# Patient Record
Sex: Male | Born: 1950 | Race: Black or African American | Hispanic: No | Marital: Married | Smoking: Former smoker
Health system: Southern US, Community
[De-identification: ages and names within clinical notes are randomized; demographics above are authoritative.]

## PROBLEM LIST (undated history)

## (undated) DIAGNOSIS — J189 Pneumonia, unspecified organism: Secondary | ICD-10-CM

## (undated) DIAGNOSIS — I2510 Atherosclerotic heart disease of native coronary artery without angina pectoris: Secondary | ICD-10-CM

## (undated) DIAGNOSIS — I209 Angina pectoris, unspecified: Secondary | ICD-10-CM

## (undated) DIAGNOSIS — I219 Acute myocardial infarction, unspecified: Secondary | ICD-10-CM

## (undated) DIAGNOSIS — E7800 Pure hypercholesterolemia, unspecified: Secondary | ICD-10-CM

## (undated) DIAGNOSIS — J449 Chronic obstructive pulmonary disease, unspecified: Secondary | ICD-10-CM

## (undated) DIAGNOSIS — I10 Essential (primary) hypertension: Secondary | ICD-10-CM

## (undated) DIAGNOSIS — R0602 Shortness of breath: Secondary | ICD-10-CM

## (undated) DIAGNOSIS — M1990 Unspecified osteoarthritis, unspecified site: Secondary | ICD-10-CM

## (undated) HISTORY — DX: Unspecified osteoarthritis, unspecified site: M19.90

## (undated) HISTORY — DX: Essential (primary) hypertension: I10

## (undated) HISTORY — PX: CERVICAL DISC SURGERY: SHX588

## (undated) HISTORY — DX: Pure hypercholesterolemia, unspecified: E78.00

## (undated) HISTORY — PX: CORONARY ANGIOPLASTY WITH STENT PLACEMENT: SHX49

## (undated) HISTORY — DX: Atherosclerotic heart disease of native coronary artery without angina pectoris: I25.10

---

## 1997-11-22 ENCOUNTER — Emergency Department (HOSPITAL_COMMUNITY): Admission: EM | Admit: 1997-11-22 | Discharge: 1997-11-22 | Payer: Self-pay | Admitting: Emergency Medicine

## 1997-12-11 ENCOUNTER — Encounter: Admission: RE | Admit: 1997-12-11 | Discharge: 1997-12-11 | Payer: Self-pay | Admitting: Family Medicine

## 1997-12-30 ENCOUNTER — Encounter: Admission: RE | Admit: 1997-12-30 | Discharge: 1997-12-30 | Payer: Self-pay | Admitting: Sports Medicine

## 2003-11-24 ENCOUNTER — Emergency Department (HOSPITAL_COMMUNITY): Admission: EM | Admit: 2003-11-24 | Discharge: 2003-11-24 | Payer: Self-pay | Admitting: Family Medicine

## 2005-12-04 ENCOUNTER — Emergency Department (HOSPITAL_COMMUNITY): Admission: EM | Admit: 2005-12-04 | Discharge: 2005-12-04 | Payer: Self-pay | Admitting: Emergency Medicine

## 2007-04-15 ENCOUNTER — Emergency Department (HOSPITAL_COMMUNITY): Admission: EM | Admit: 2007-04-15 | Discharge: 2007-04-15 | Payer: Self-pay | Admitting: Emergency Medicine

## 2007-10-17 ENCOUNTER — Ambulatory Visit: Payer: Self-pay | Admitting: Internal Medicine

## 2007-10-17 ENCOUNTER — Inpatient Hospital Stay (HOSPITAL_COMMUNITY): Admission: EM | Admit: 2007-10-17 | Discharge: 2007-10-20 | Payer: Self-pay | Admitting: Emergency Medicine

## 2007-10-17 DIAGNOSIS — I2510 Atherosclerotic heart disease of native coronary artery without angina pectoris: Secondary | ICD-10-CM | POA: Insufficient documentation

## 2007-10-19 ENCOUNTER — Encounter: Payer: Self-pay | Admitting: Cardiovascular Disease

## 2007-10-19 ENCOUNTER — Ambulatory Visit: Payer: Self-pay | Admitting: Vascular Surgery

## 2007-11-02 ENCOUNTER — Ambulatory Visit: Payer: Self-pay | Admitting: Cardiovascular Disease

## 2007-11-02 LAB — CONVERTED CEMR LAB
HCT: 37.3 % — ABNORMAL LOW (ref 39.0–52.0)
Hemoglobin: 11.9 g/dL — ABNORMAL LOW (ref 13.0–17.0)
Lymphocytes Relative: 35.1 % (ref 12.0–46.0)
MCV: 86.1 fL (ref 78.0–100.0)
Monocytes Relative: 12.8 % — ABNORMAL HIGH (ref 3.0–12.0)
Neutrophils Relative %: 49.4 % (ref 43.0–77.0)
RBC: 4.33 M/uL (ref 4.22–5.81)
RDW: 13.6 % (ref 11.5–14.6)

## 2007-11-07 ENCOUNTER — Ambulatory Visit: Payer: Self-pay | Admitting: Cardiovascular Disease

## 2007-11-14 ENCOUNTER — Inpatient Hospital Stay (HOSPITAL_COMMUNITY): Admission: AD | Admit: 2007-11-14 | Discharge: 2007-11-15 | Payer: Self-pay | Admitting: Cardiovascular Disease

## 2007-11-14 ENCOUNTER — Ambulatory Visit: Payer: Self-pay | Admitting: Cardiovascular Disease

## 2007-11-27 ENCOUNTER — Ambulatory Visit: Payer: Self-pay | Admitting: Cardiovascular Disease

## 2007-12-29 ENCOUNTER — Ambulatory Visit: Payer: Self-pay | Admitting: Cardiovascular Disease

## 2008-01-26 ENCOUNTER — Emergency Department (HOSPITAL_COMMUNITY): Admission: EM | Admit: 2008-01-26 | Discharge: 2008-01-26 | Payer: Self-pay | Admitting: Emergency Medicine

## 2008-05-13 ENCOUNTER — Ambulatory Visit: Payer: Self-pay | Admitting: Cardiovascular Disease

## 2008-05-13 LAB — CONVERTED CEMR LAB
ALT: 18 U/L
AST: 21 U/L
Albumin: 3.9 g/dL
Alkaline Phosphatase: 48 U/L
Bilirubin, Direct: 0.1 mg/dL
Cholesterol: 184 mg/dL
HDL: 75.4 mg/dL
LDL Cholesterol: 89 mg/dL
Total Bilirubin: 0.6 mg/dL
Total CHOL/HDL Ratio: 2.4
Total Protein: 8 g/dL
Triglycerides: 100 mg/dL
VLDL: 20 mg/dL

## 2008-05-18 DIAGNOSIS — I10 Essential (primary) hypertension: Secondary | ICD-10-CM | POA: Insufficient documentation

## 2008-05-18 DIAGNOSIS — E785 Hyperlipidemia, unspecified: Secondary | ICD-10-CM | POA: Insufficient documentation

## 2008-05-18 DIAGNOSIS — E7800 Pure hypercholesterolemia, unspecified: Secondary | ICD-10-CM | POA: Insufficient documentation

## 2008-05-21 ENCOUNTER — Encounter: Payer: Self-pay | Admitting: Cardiovascular Disease

## 2008-05-21 ENCOUNTER — Ambulatory Visit: Payer: Self-pay | Admitting: Cardiovascular Disease

## 2008-08-28 ENCOUNTER — Telehealth: Payer: Self-pay | Admitting: Cardiovascular Disease

## 2008-09-02 ENCOUNTER — Telehealth: Payer: Self-pay | Admitting: Cardiovascular Disease

## 2008-09-03 ENCOUNTER — Emergency Department (HOSPITAL_COMMUNITY): Admission: EM | Admit: 2008-09-03 | Discharge: 2008-09-03 | Payer: Self-pay | Admitting: Emergency Medicine

## 2008-09-03 ENCOUNTER — Ambulatory Visit: Payer: Self-pay | Admitting: Cardiology

## 2008-11-06 ENCOUNTER — Telehealth (INDEPENDENT_AMBULATORY_CARE_PROVIDER_SITE_OTHER): Payer: Self-pay | Admitting: *Deleted

## 2008-11-11 ENCOUNTER — Encounter: Payer: Self-pay | Admitting: Cardiovascular Disease

## 2009-01-28 ENCOUNTER — Encounter (INDEPENDENT_AMBULATORY_CARE_PROVIDER_SITE_OTHER): Payer: Self-pay | Admitting: *Deleted

## 2009-02-11 ENCOUNTER — Telehealth: Payer: Self-pay | Admitting: Cardiovascular Disease

## 2009-02-26 ENCOUNTER — Encounter (INDEPENDENT_AMBULATORY_CARE_PROVIDER_SITE_OTHER): Payer: Self-pay | Admitting: *Deleted

## 2009-03-03 ENCOUNTER — Ambulatory Visit: Payer: Self-pay | Admitting: Cardiovascular Disease

## 2009-03-31 ENCOUNTER — Encounter (INDEPENDENT_AMBULATORY_CARE_PROVIDER_SITE_OTHER): Payer: Self-pay | Admitting: *Deleted

## 2009-04-03 ENCOUNTER — Encounter: Payer: Self-pay | Admitting: Cardiovascular Disease

## 2009-04-22 ENCOUNTER — Telehealth (INDEPENDENT_AMBULATORY_CARE_PROVIDER_SITE_OTHER): Payer: Self-pay | Admitting: *Deleted

## 2009-05-13 ENCOUNTER — Telehealth: Payer: Self-pay | Admitting: Cardiovascular Disease

## 2009-05-26 ENCOUNTER — Ambulatory Visit: Payer: Self-pay | Admitting: Cardiovascular Disease

## 2009-06-03 ENCOUNTER — Encounter: Payer: Self-pay | Admitting: Cardiovascular Disease

## 2009-06-04 LAB — CONVERTED CEMR LAB
Albumin: 3.8 g/dL (ref 3.5–5.2)
Alkaline Phosphatase: 53 units/L (ref 39–117)
BUN: 10 mg/dL (ref 6–23)
CO2: 30 meq/L (ref 19–32)
Calcium: 9.4 mg/dL (ref 8.4–10.5)
Chloride: 98 meq/L (ref 96–112)
Cholesterol: 205 mg/dL — ABNORMAL HIGH (ref 0–200)
Creatinine, Ser: 1.1 mg/dL (ref 0.4–1.5)
Direct LDL: 131.4 mg/dL
Glucose, Bld: 114 mg/dL — ABNORMAL HIGH (ref 70–99)
HDL: 73.7 mg/dL (ref >39.00)
Sodium: 138 meq/L (ref 135–145)
VLDL: 31 mg/dL (ref 0.0–40.0)

## 2009-06-20 ENCOUNTER — Telehealth (INDEPENDENT_AMBULATORY_CARE_PROVIDER_SITE_OTHER): Payer: Self-pay | Admitting: *Deleted

## 2009-06-26 ENCOUNTER — Telehealth: Payer: Self-pay | Admitting: *Deleted

## 2009-06-26 ENCOUNTER — Telehealth: Payer: Self-pay | Admitting: Family Medicine

## 2009-07-09 ENCOUNTER — Telehealth (INDEPENDENT_AMBULATORY_CARE_PROVIDER_SITE_OTHER): Payer: Self-pay | Admitting: *Deleted

## 2009-07-15 ENCOUNTER — Encounter: Payer: Self-pay | Admitting: *Deleted

## 2009-08-02 ENCOUNTER — Emergency Department (HOSPITAL_COMMUNITY): Admission: EM | Admit: 2009-08-02 | Discharge: 2009-08-02 | Payer: Self-pay | Admitting: Emergency Medicine

## 2009-08-11 ENCOUNTER — Telehealth (INDEPENDENT_AMBULATORY_CARE_PROVIDER_SITE_OTHER): Payer: Self-pay | Admitting: *Deleted

## 2009-09-12 ENCOUNTER — Telehealth (INDEPENDENT_AMBULATORY_CARE_PROVIDER_SITE_OTHER): Payer: Self-pay | Admitting: *Deleted

## 2009-09-18 ENCOUNTER — Ambulatory Visit: Payer: Self-pay | Admitting: Cardiovascular Disease

## 2009-09-18 ENCOUNTER — Ambulatory Visit: Payer: Self-pay

## 2010-06-16 NOTE — Miscellaneous (Signed)
Summary: Do Not Reschedule  Pt has no showed 2 NP appts.  Per [facility] policy is not allowed to reschedule.  Dennison Nancy RN  July 15, 2009 9:36 PM

## 2010-06-16 NOTE — Assessment & Plan Note (Signed)
Summary: f53m   Visit Type:  6 months follow up Primary Provider:  none  CC:  No complaints.  History of Present Illness: 60 year-old [name] with CAD s/p NSTEMI June 2009. Treated with PCI of the RCA with multiple overlapping DES and staged PCI of the LCx. His only complaint is episodic ankle swelling. He denies pain in his calf muscles. He denies chest pain, dyspnea, orthopnea, or PND. He has been compliant with his medications and has no other complaints. He reports blood pressure last week was 131/55 when checked at the pharmacy.  Current Medications (verified): 1)  Coreg 25 Mg Tabs (Carvedilol) .... Take 1 Tablet By Mouth Twice A Day 2)  Lisinopril 20 Mg Tabs (Lisinopril) .... Take 1 Tablet Once A Day 3)  Plavix 75 Mg Tabs (Clopidogrel Bisulfate) .... Take 1 Tablet By Mouth Once A Day 4)  Cialis 10 Mg Tabs (Tadalafil) .... As Needed 5)  Simvastatin 40 Mg Tabs (Simvastatin) .... Take One Tablet By Mouth Daily At Bedtime 6)  Amlodipine Besylate 5 Mg Tabs (Amlodipine Besylate) .... Take One Tablet By Mouth Daily 7)  Hydrochlorothiazide 12.5 Mg Tabs (Hydrochlorothiazide) .... Take One Tablet By Mouth Daily. 8)  Aspirin Ec 325 Mg Tbec (Aspirin) .... Take One Tablet By Mouth Daily 9)  Nitrostat 0.4 Mg Subl (Nitroglycerin) .Marland Kitchen.. 1 Tablet Under Tongue At Onset of Chest Pain; You May Repeat Every 5 Minutes For Up To 3 Doses.  Allergies (verified): No Known Drug Allergies  Past History:  Past medical history reviewed for relevance to current acute and chronic problems.  Past Medical History: Reviewed history from 03/03/2009 and no changes required. CAD s/p NSTEMI 10/2007, RCA and LCx PCI with drug-eluting stents HTN, poorly controlled DJD Hypercholesterolemia  Review of Systems       Negative except as per HPI   Vital Signs:  Patient profile:   60 year old male Height:      73 inches Weight:      175.75 pounds BMI:     23.27 Pulse rate:   73 / minute Pulse rhythm:    regular Resp:     18 per minute BP sitting:   160 / 94  (left arm) Cuff size:   large  Vitals Entered By: Vikki Ports (Sep 18, 2009 11:20 AM)  Physical Exam  General:  Pt is alert and oriented, in no acute distress. HEENT: normal Neck: normal carotid upstrokes without bruits, JVP normal Lungs: CTA CV: RRR without murmur or gallop Abd: soft, NT, positive BS, no bruit, no organomegaly Ext: 1+ bilateral ankle edema. peripheral pulses diminished bilaterally Skin: warm and dry without rash    EKG  Procedure date:  09/18/2009  Findings:      Normal sinus rhythm, heart rate 73 beats per minute, cannot rule out inferior MI age indeterminate.  Impression & Recommendations:  Problem # 1:  CAD, NATIVE VESSEL (ICD-414.01) The patient is stable without angina. He needs better risk reduction measures and will try to escalate his medical therapy for hypertension hyperlipidemia (see below).  Continue dual antiplatelet therapy with aspirin and Plavix.  His updated medication list for this problem includes:    Coreg 25 Mg Tabs (Carvedilol) .Marland Kitchen... Take 1 tablet by mouth twice a day    Lisinopril 40 Mg Tabs (Lisinopril) .Marland Kitchen... Take one tablet by mouth daily    Plavix 75 Mg Tabs (Clopidogrel bisulfate) .Marland Kitchen... Take 1 tablet by mouth once a day    Amlodipine Besylate 5 Mg Tabs (Amlodipine  besylate) .Marland Kitchen... Take one tablet by mouth daily    Aspirin Ec 325 Mg Tbec (Aspirin) .Marland Kitchen... Take one tablet by mouth daily    Nitrostat 0.4 Mg Subl (Nitroglycerin) .Marland Kitchen... 1 tablet under tongue at onset of chest pain; you may repeat every 5 minutes for up to 3 doses.  Orders: EKG w/ Interpretation (93000) Arterial Duplex Lower Extremity (Arterial Duplex Low)  Problem # 2:  HYPERTENSION, BENIGN (ICD-401.1) Suboptimal control. Will double lisinopril to 40 mg and double hydrochlorothiazide to 25 mg daily His updated medication list for this problem includes:    Coreg 25 Mg Tabs (Carvedilol) .Marland Kitchen... Take 1 tablet by  mouth twice a day    Lisinopril 40 Mg Tabs (Lisinopril) .Marland Kitchen... Take one tablet by mouth daily    Amlodipine Besylate 5 Mg Tabs (Amlodipine besylate) .Marland Kitchen... Take one tablet by mouth daily    Hydrochlorothiazide 25 Mg Tabs (Hydrochlorothiazide) .Marland Kitchen... Take one tablet by mouth daily.    Aspirin Ec 325 Mg Tbec (Aspirin) .Marland Kitchen... Take one tablet by mouth daily  Orders: EKG w/ Interpretation (93000) Arterial Duplex Lower Extremity (Arterial Duplex Low)  BP today: 160/94 Prior BP: 200/104 (03/03/2009)  Labs Reviewed: K+: 4.2 (05/26/2009) Creat: : 1.1 (05/26/2009)   Chol: 205 (05/26/2009)   HDL: 73.70 (05/26/2009)   LDL: 89 (05/13/2008)   TG: 155.0 (05/26/2009)  Problem # 3:  HYPERLIPIDEMIA-MIXED (ICD-272.4) Direct LDL was 131 mg per deciliter in January of this year. Recommend change to Crestor for more aggressive lipid-lowering.  His updated medication list for this problem includes:    Crestor 20 Mg Tabs (Rosuvastatin calcium) .Marland Kitchen... Take one tablet by mouth daily.  Orders: EKG w/ Interpretation (93000) Arterial Duplex Lower Extremity (Arterial Duplex Low)  CHOL: 205 (05/26/2009)   LDL: 89 (05/13/2008)   HDL: 73.70 (05/26/2009)   TG: 155.0 (05/26/2009)  Patient Instructions: 1)  Your physician wants you to follow-up in:   6 MONTHS. You will receive a reminder letter in the mail two months in advance. If you don't receive a letter, please call our office to schedule the follow-up appointment. 2)  Your physician recommends that you return for a FASTING LIPID, LIVER and BMP in 8 WEEKS (414.01, 40.19, 272.0) 3)  Your physician has recommended you make the following change in your medication: STOP  Simvastatin, START Crestor 20mg  once a day, INCREASE Lisinopril to 40mg  once a day, INCREASE HCTZ to 25mg  once a day 4)  Your physician has requested that you have an ankle brachial index (ABI). During this test an ultrasound and blood pressure cuff are used to evaluate the arteries that supply the arms  and legs with blood. Allow thirty minutes for this exam. There are no restrictions or special instructions. Prescriptions: HYDROCHLOROTHIAZIDE 25 MG TABS (HYDROCHLOROTHIAZIDE) Take one tablet by mouth daily.  #30 x 8   Entered by:   Julieta Gutting, RN, BSN   Authorized by:   Norva Karvonen, MD   Signed by:   Julieta Gutting, RN, BSN on 09/18/2009   Method used:   Electronically to        [facility] (retail)       [address]       [place], [place]  [address]       Ph: [phone]       Fax: [phone]   RxID:   [id number] CRESTOR 20 MG TABS (ROSUVASTATIN CALCIUM) Take one tablet by mouth daily.  #30 x 8  Entered by:   Julieta Gutting, RN, BSN   Authorized by:   Norva Karvonen, MD   Signed by:   Julieta Gutting, RN, BSN on 09/18/2009   Method used:   Electronically to        [facility] (retail)       [address]       [place], [place]  [address]       Ph: [phone]       Fax: [phone]   RxID:   [id number] LISINOPRIL 40 MG TABS (LISINOPRIL) Take one tablet by mouth daily  #30 x 8   Entered by:   Julieta Gutting, RN, BSN   Authorized by:   Norva Karvonen, MD   Signed by:   Julieta Gutting, RN, BSN on 09/18/2009   Method used:   Electronically to        [facility] (retail)       [address]       [place], [place]  [address]       Ph: [phone]       Fax: [phone]   RxID:   [id number]

## 2010-06-16 NOTE — Progress Notes (Signed)
Summary: REFILL MEDS  Phone Note Refill Request Call back at Work Phone  Call back at [phone] #  Message from:  Patient on September 12, 2009 8:14 AM  Refills Requested: Medication #1:  PLAVIX 75 MG TABS Take 1 tablet by mouth once a day  Medication #2:  NITROSTAT 0.4 MG SUBL 1 tablet under tongue at onset of chest pain; you may repeat every 5 minutes for up to 3 doses.[facility] [address]    Method Requested: Fax to Local Pharmacy Initial call taken by: Lorne Skeens,  September 12, 2009 8:15 AM  Follow-up for Phone Call        Rx faxed to pharmacy [facility] Follow-up by: Oswald Hillock,  September 12, 2009 8:38 AM    Prescriptions: NITROSTAT 0.4 MG SUBL (NITROGLYCERIN) 1 tablet under tongue at onset of chest pain; you may repeat every 5 minutes for up to 3 doses.  #25 Each x 1   Entered by:   Oswald Hillock   Authorized by:   Norva Karvonen, MD   Signed by:   Oswald Hillock on 09/12/2009   Method used:   Faxed to ...       [facility] (retail)       [address]       [place], [place]  [address]       Ph: [phone]       Fax: [phone]   RxID:   [id number] PLAVIX 75 MG TABS (CLOPIDOGREL BISULFATE) Take 1 tablet by mouth once a day  #30 x 6   Entered by:   Oswald Hillock   Authorized by:   Norva Karvonen, MD   Signed by:   Oswald Hillock on 09/12/2009   Method used:   Faxed to ...       [facility] ([facility])       [address]       [place], [place]  [address]       Ph: [phone]       Fax: [phone]   RxID:   [id number]

## 2010-06-16 NOTE — Progress Notes (Signed)
Summary: refill  Phone Note Refill Request Call back at [phone] Message from:  Patient on June 20, 2009 9:28 AM  Refills Requested: Medication #1:  COREG 25 MG TABS Take 1 tablet by mouth twice a day   Supply Requested: 6 months [facility] [address]   Method Requested: Fax to Local Pharmacy Initial call taken by: Migdalia Dk,  June 20, 2009 9:30 AM  Follow-up for Phone Call        sent to [facility]  Coreg 25 mg 90x 1 Follow-up by: Oswald Hillock,  June 20, 2009 9:47 AM    Prescriptions: COREG 25 MG TABS (CARVEDILOL) Take 1 tablet by mouth twice a day  #180 x 1   Entered by:   Oswald Hillock   Authorized by:   Norva Karvonen, MD   Signed by:   Oswald Hillock on 06/20/2009   Method used:   Electronically to        [facility] (retail)       [address]       [place], [place]  [address]       Ph: [phone]       Fax: [phone]   RxID:   [id number]

## 2010-06-16 NOTE — Progress Notes (Signed)
Summary: phn msg  Phone Note Call from Patient   Caller: Spouse Summary of Call: Pt had several teeth extracted yesterday unexpectantly and was unable to make appt today due to swelling and pain. Initial call taken by: Clydell Hakim,  June 26, 2009 10:08 AM  Follow-up for Phone Call        To PCP Follow-up by: Gladstone Pih,  June 26, 2009 11:55 AM

## 2010-06-16 NOTE — Progress Notes (Signed)
Summary: refill meds  Phone Note Refill Request Call back at Home Phone [phone] Message from:  Patient on July 09, 2009 2:44 PM  Refills Requested: Medication #1:  NITROSTAT 0.4 MG SUBL 1 tablet under tongue at onset of chest pain; you may repeat every 5 minutes for up to 3 doses.[facility] [address]    Caryn Section Requested: Fax to Local Pharmacy Initial call taken by: Lorne Skeens,  July 09, 2009 2:45 PM  Follow-up for Phone Call        Rx faxed to pharmacy Follow-up by: Oswald Hillock,  July 09, 2009 2:56 PM    Prescriptions: NITROSTAT 0.4 MG SUBL (NITROGLYCERIN) 1 tablet under tongue at onset of chest pain; you may repeat every 5 minutes for up to 3 doses.  #25 Each x 1   Entered by:   Oswald Hillock   Authorized by:   Norva Karvonen, MD   Signed by:   Oswald Hillock on 07/09/2009   Method used:   Electronically to        [facility] (retail)       [address]       [place], [place]  [address]       Ph: [phone]       Fax: [phone]   RxID:   [id number]

## 2010-06-16 NOTE — Progress Notes (Signed)
Summary: pt needs refill  Phone Note Refill Request Call back at Home Phone [phone] Message from:  Patient on [facility]  Refills Requested: Medication #1:  PLAVIX 75 MG TABS Take 1 tablet by mouth once a day pt needs refill  Initial call taken by: Omer Jack,  August 11, 2009 2:46 PM  Follow-up for Phone Call        Rx faxed to pharmacy Follow-up by: Vikki Ports,  August 13, 2009 4:13 PM    Prescriptions: PLAVIX 75 MG TABS (CLOPIDOGREL BISULFATE) Take 1 tablet by mouth once a day  #30 x 6   Entered by:   Vikki Ports   Authorized by:   Norva Karvonen, MD   Signed by:   Vikki Ports on 08/13/2009   Method used:   Faxed to ...       [facility]       [address]       [place], [place]  [address]       Ph: [phone]       Fax: [phone]   RxID:   [id number]

## 2010-06-16 NOTE — Miscellaneous (Signed)
Summary: Lisinopril Rx  Clinical Lists Changes  Medications: Changed medication from LISINOPRIL 20 MG TABS (LISINOPRIL) Take 1 tablet once a day to LISINOPRIL 20 MG TABS (LISINOPRIL) Take 1 tablet once a day - Signed Rx of LISINOPRIL 20 MG TABS (LISINOPRIL) Take 1 tablet once a day;  #30 x 11;  Signed;  Entered by: Julieta Gutting, RN, BSN;  Authorized by: Norva Karvonen, MD;  Method used: Electronically to [facility] [address] [place], [place], [place]  [address], Ph: [phone], Fax: [phone]    Prescriptions: LISINOPRIL 20 MG TABS (LISINOPRIL) Take 1 tablet once a day  #30 x 11   Entered by:   Julieta Gutting, RN, BSN   Authorized by:   Norva Karvonen, MD   Signed by:   Julieta Gutting, RN, BSN on 06/03/2009   Method used:   Electronically to        [facility] (retail)       [address]       [place], [place]  [address]       Ph: [phone]       Fax: [phone]   RxID:   [id number]

## 2010-08-09 LAB — SYNOVIAL CELL COUNT + DIFF, W/ CRYSTALS
Crystals, Fluid: NONE SEEN
Lymphocytes-Synovial Fld: 0 % (ref 0–20)
Neutrophil, Synovial: 75 % — ABNORMAL HIGH (ref 0–25)
WBC, Synovial: 33135 /mm3 — ABNORMAL HIGH (ref 0–200)

## 2010-08-09 LAB — POCT I-STAT, CHEM 8
BUN: 5 mg/dL — ABNORMAL LOW (ref 6–23)
Calcium, Ion: 1.09 mmol/L — ABNORMAL LOW (ref 1.12–1.32)
Chloride: 98 mEq/L (ref 96–112)
Creatinine, Ser: 0.9 mg/dL (ref 0.4–1.5)
Glucose, Bld: 111 mg/dL — ABNORMAL HIGH (ref 70–99)
HCT: 35 % — ABNORMAL LOW (ref 39.0–52.0)
Sodium: 138 mEq/L (ref 135–145)
TCO2: 32 mmol/L (ref 0–100)

## 2010-08-09 LAB — CBC: Hemoglobin: 10.9 g/dL — ABNORMAL LOW (ref 13.0–17.0)

## 2010-08-09 LAB — DIFFERENTIAL
Basophils Relative: 0 % (ref 0–1)
Eosinophils Absolute: 0 10*3/uL (ref 0.0–0.7)
Lymphocytes Relative: 36 % (ref 12–46)
Neutrophils Relative %: 52 % (ref 43–77)

## 2010-08-10 ENCOUNTER — Encounter: Payer: Self-pay | Admitting: Internal Medicine

## 2010-08-19 ENCOUNTER — Encounter: Payer: Self-pay | Admitting: Cardiovascular Disease

## 2010-08-19 ENCOUNTER — Ambulatory Visit (INDEPENDENT_AMBULATORY_CARE_PROVIDER_SITE_OTHER): Payer: Medicare Other | Admitting: Cardiovascular Disease

## 2010-08-19 DIAGNOSIS — I2510 Atherosclerotic heart disease of native coronary artery without angina pectoris: Secondary | ICD-10-CM

## 2010-08-19 DIAGNOSIS — I10 Essential (primary) hypertension: Secondary | ICD-10-CM

## 2010-08-19 DIAGNOSIS — E785 Hyperlipidemia, unspecified: Secondary | ICD-10-CM

## 2010-08-19 MED ORDER — HYDROCHLOROTHIAZIDE 25 MG PO TABS: 25.0000 mg | ORAL_TABLET | Freq: Every day | ORAL | Status: DC

## 2010-08-19 MED ORDER — LISINOPRIL 40 MG PO TABS: 40.0000 mg | ORAL_TABLET | Freq: Every day | ORAL | Status: DC

## 2010-08-19 NOTE — Assessment & Plan Note (Signed)
The patient is on statin therapy with simvastatin. When he comes back for lab work in 2 weeks we'll check a fasting lipid panel.

## 2010-08-19 NOTE — Patient Instructions (Signed)
Your physician recommends that you schedule a follow-up appointment in: 12 months with Dr. Excell Seltzer Your physician has recommended you make the following change in your medication: Increase Lisinopril to 40 mg by mouth daily. Increase hydrochlorothiazide to 25 mg by mouth daily. Your physician recommends that you return for fasting lab work in: 2 weeks.  272.0,401.1

## 2010-08-19 NOTE — Assessment & Plan Note (Signed)
Blood pressure control remains suboptimal. I've asked him to increase his hydrochlorothiazide to 25 mg daily and increase lisinopril to 40 mg daily. The patient will return for lab work in about 2 weeks to check a metabolic panel.

## 2010-08-19 NOTE — Progress Notes (Signed)
HPI:  This is a 60 year old gentleman presenting for followup of coronary artery disease. The patient initially presented in 2009 with a non-ST elevation infarction and he underwent stenting of his right coronary artery with multiple overlapping drug-eluting stents. He then underwent staged PCI of the left circumflex. He has done well since his initial presentation and has had no recurrent cardiac events.  The patient is doing well at present. He has occasional episodes of postural dizziness. He denies near syncope or frank syncope. He denies exertional chest pain or pressure. He has not engaged in regular exercise but he does maintain an active lifestyle. He's been trying to eat predominately baked foods rather than fried foods. He denies edema, opticians, orthopnea, or PND. He reports compliance with his medications. He has no complaints at this time.    Outpatient Encounter Prescriptions as of 08/19/2010  Medication Sig Dispense Refill  . amLODipine (NORVASC) 5 MG tablet Take 5 mg by mouth daily.        Marland Kitchen aspirin 325 MG tablet Take 325 mg by mouth daily.        . carvedilol (COREG) 25 MG tablet Take 25 mg by mouth 2 (two) times daily with a meal.        . clopidogrel (PLAVIX) 75 MG tablet Take 75 mg by mouth daily.        . hydrochlorothiazide (,MICROZIDE/HYDRODIURIL,) 12.5 MG capsule Take 12.5 mg by mouth daily.        Marland Kitchen lisinopril (PRINIVIL,ZESTRIL) 20 MG tablet Take 20 mg by mouth daily.        . nitroGLYCERIN (NITROSTAT) 0.4 MG SL tablet Place 0.4 mg under the tongue every 5 (five) minutes as needed.        . simvastatin (ZOCOR) 40 MG tablet Take 40 mg by mouth at bedtime.        . tadalafil (CIALIS) 10 MG tablet Take 10 mg by mouth daily as needed.        Marland Kitchen DISCONTD: hydrochlorothiazide 25 MG tablet Take 25 mg by mouth daily.        Marland Kitchen DISCONTD: lisinopril (PRINIVIL,ZESTRIL) 40 MG tablet Take 40 mg by mouth daily.        Marland Kitchen DISCONTD: rosuvastatin (CRESTOR) 20 MG tablet Take 20 mg by mouth  daily.          No Known Allergies  Past Medical History  Diagnosis Date  . Coronary artery disease     s/p NSTEMI 10/2007, RCA and LCx PCI with drug-eluting stents  . Hypertension     Poorly controlled  . DJD (degenerative joint disease)   . Hypercholesterolemia     ROS: Negative except as per HPI  BP 143/83  Pulse 62  Resp 18  Ht 6\' 1"  (1.854 m)  Wt 185 lb 1.9 oz (83.97 kg)  BMI 24.42 kg/m2  PHYSICAL EXAM: Pt is alert and oriented, very pleasant overweight male in NAD HEENT: normal Neck: JVP - normal, carotids 2+= without bruits Lungs: CTA bilaterally CV: RRR without murmur or gallop Abd: soft, NT, Positive BS, no hepatomegaly Ext: no C/C/E, distal pulses intact and equal Skin: warm/dry no rash  EKG:  Normal sinus rhythm at 60 beats per minute, within normal limits.  ASSESSMENT AND PLAN:

## 2010-08-19 NOTE — Assessment & Plan Note (Signed)
The patient is stable without anginal symptoms. He has undergone multivessel stenting and I would recommend continuing long-term aspirin and Plavix as long as he is not having bleeding problems. I've asked him to reduce his aspirin dose to 81 mg. Will see him back in one year for followup evaluation.

## 2010-08-26 LAB — BASIC METABOLIC PANEL
CO2: 25 mEq/L (ref 19–32)
Chloride: 104 mEq/L (ref 96–112)
Creatinine, Ser: 1.26 mg/dL (ref 0.4–1.5)
Glucose, Bld: 156 mg/dL — ABNORMAL HIGH (ref 70–99)

## 2010-08-26 LAB — CBC
MCHC: 32.7 g/dL (ref 30.0–36.0)
RBC: 4.62 MIL/uL (ref 4.22–5.81)
RDW: 15.6 % — ABNORMAL HIGH (ref 11.5–15.5)

## 2010-08-26 LAB — DIFFERENTIAL
Basophils Absolute: 0 10*3/uL (ref 0.0–0.1)
Basophils Relative: 1 % (ref 0–1)
Eosinophils Relative: 2 % (ref 0–5)
Monocytes Absolute: 0.7 10*3/uL (ref 0.1–1.0)

## 2010-08-26 LAB — CK TOTAL AND CKMB (NOT AT ARMC)
Relative Index: INVALID (ref 0.0–2.5)
Total CK: 71 U/L (ref 7–232)

## 2010-09-02 ENCOUNTER — Other Ambulatory Visit (INDEPENDENT_AMBULATORY_CARE_PROVIDER_SITE_OTHER): Payer: Medicare Other | Admitting: Cardiovascular Disease

## 2010-09-02 ENCOUNTER — Other Ambulatory Visit (INDEPENDENT_AMBULATORY_CARE_PROVIDER_SITE_OTHER): Payer: Medicare Other | Admitting: *Deleted

## 2010-09-02 DIAGNOSIS — I10 Essential (primary) hypertension: Secondary | ICD-10-CM

## 2010-09-02 DIAGNOSIS — E785 Hyperlipidemia, unspecified: Secondary | ICD-10-CM

## 2010-09-02 LAB — BASIC METABOLIC PANEL
BUN: 13 mg/dL (ref 6–23)
CO2: 27 mEq/L (ref 19–32)
Chloride: 107 mEq/L (ref 96–112)
Creatinine, Ser: 0.9 mg/dL (ref 0.4–1.5)
Glucose, Bld: 99 mg/dL (ref 70–99)

## 2010-09-02 LAB — HEPATIC FUNCTION PANEL
ALT: 30 U/L (ref 0–53)
Bilirubin, Direct: 0.1 mg/dL (ref 0.0–0.3)
Total Protein: 7.3 g/dL (ref 6.0–8.3)

## 2010-09-02 LAB — LDL CHOLESTEROL, DIRECT: Direct LDL: 149.1 mg/dL

## 2010-09-02 LAB — LIPID PANEL
Cholesterol: 239 mg/dL — ABNORMAL HIGH (ref 0–200)
Triglycerides: 70 mg/dL (ref 0.0–149.0)

## 2010-09-10 ENCOUNTER — Other Ambulatory Visit: Payer: Self-pay | Admitting: Cardiovascular Disease

## 2010-09-14 ENCOUNTER — Other Ambulatory Visit: Payer: Self-pay | Admitting: Cardiovascular Disease

## 2010-09-14 ENCOUNTER — Telehealth: Payer: Self-pay | Admitting: Cardiovascular Disease

## 2010-09-14 NOTE — Telephone Encounter (Signed)
This is a duplicate request--please see documentation on previous request.

## 2010-09-14 NOTE — Telephone Encounter (Signed)
Pt requesting refill requested 4-26 be filled today if possible for 10 mg of cialis 3 pills only [facility] [address]

## 2010-09-14 NOTE — Telephone Encounter (Signed)
Pt needs cialis [facility]ill. [facility] # [phone]

## 2010-09-14 NOTE — Telephone Encounter (Signed)
Will route this to Doree Fudge does Dr. Excell Seltzer refill Cialis?

## 2010-09-14 NOTE — Telephone Encounter (Signed)
Okay to refill Cialis--#12 refill times 2.

## 2010-09-14 NOTE — Telephone Encounter (Signed)
Ok to Authorize?

## 2010-09-15 ENCOUNTER — Other Ambulatory Visit: Payer: Self-pay | Admitting: *Deleted

## 2010-09-15 MED ORDER — TADALAFIL 10 MG PO TABS: 10.0000 mg | ORAL_TABLET | ORAL | Status: DC

## 2010-09-16 ENCOUNTER — Other Ambulatory Visit: Payer: Self-pay | Admitting: Cardiovascular Disease

## 2010-09-17 NOTE — Telephone Encounter (Signed)
Okay to fill this medication--#12, refill x 2.  This is probably the third time I have received this request. Will route to Doree Fudge to refill.

## 2010-09-17 NOTE — Telephone Encounter (Signed)
Authorization

## 2010-09-26 ENCOUNTER — Other Ambulatory Visit: Payer: Self-pay | Admitting: Cardiovascular Disease

## 2010-09-29 NOTE — Discharge Summary (Signed)
NAME:  [name], [name] NO.:  [id number]   MEDICAL RECORD NO.:  [id number]          PATIENT TYPE:  INP   LOCATION:  3712                         FACILITY:  [facility]   PHYSICIAN:  Veverly Fells. Excell Seltzer, MD  DATE OF BIRTH:  [birth year]   DATE OF ADMISSION:  11/14/2007  DATE OF DISCHARGE:  11/15/2007                               DISCHARGE SUMMARY   PRIMARY CARDIOLOGIST:  Veverly Fells. Excell Seltzer, MD   PRIMARY CARE Farryn Linares:  [facility], in [place].   DISCHARGE DIAGNOSIS:  Coronary artery disease.   SECONDARY DIAGNOSES:  1. Status post non-ST segment elevation myocardial infarction October 17, 2007, with percutaneous coronary intervention and stenting of the      right coronary artery with PROMUS drug-eluting stent.  The patient      had residual circumflex disease at that time.  2. Hypertension.  3. Hyperlipidemia.  4. Remote tobacco abuse with a 50-pack year history, quit in 2007 .  5. 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   ALLERGIES:  No known drug allergies.   PROCEDURES:  Left heart cardiac catheterization with successful  percutaneous coronary intervention and stenting of the proximal left  circumflex with placement of a 3.0 x 18 mm Cypher drug-eluting stent.   HISTORY OF PRESENT ILLNESS:  A 60 year old African American male with  recent non-ST segment elevation myocardial infarction treated with drug-  eluting stent placed into the right coronary artery October 17, 2007.  The  patient was subsequently discharged home on October 20, 2007, with known  residual left circumflex disease.  Subsequently followed up with Dr.  Excell Seltzer on November 07, 2007, and at that point was doing well without  recurrent dyspnea.  Arrangements were then made for staged intervention  of the left circumflex.   HOSPITAL COURSE:  The patient presented to [facility] on November 14, 2007,  for PCI of the left circumflex.  He was taken to the cardiac cath lab  and underwent  successful PCI and stenting of the left circumflex with  placement of 3.0 x 18 mm Cypher drug-eluting stent.  The patient  tolerated the procedure well and postprocedure has been ambulating  without symptoms or limitations.  His blood pressure has been elevated  throughout this admission and we have titrated beta blocker and ACE  inhibitor as well as added hydrochlorothiazide.  Being discharged home  today in good condition.   DISCHARGE LABS:  Hemoglobin 126, hematocrit 39.3, WBC 5.0, platelets  233, INR 1.0.  Sodium 138, potassium 4.4, chloride 105, CO2 27, BUN 6,  creatinine 0.79, glucose 100, calcium 9.1.   DISPOSITION:  The patient is being discharged home today in good  condition.   FOLLOW-UP PLANS AND APPOINTMENTS:  We have arranged for follow-up with  Dr. Tonny Bollman on November 27, 2007, at 12:00 p.m.  He was asked to  follow[facility] with [facility] as previously scheduled.   DISCHARGE MEDICATIONS:  1. Aspirin 325 mg daily.  2. Plavix 75 mg daily.  3. Lopressor 50 mg b.i.d.  4. Lisinopril  20 mg daily.  5. Hydrochlorothiazide 12.5 mg daily.  6. Zocor 4 mg nightly.  7. Nitroglycerin 0.4 mg sublingual p.r.n. chest pain.   OUTSTANDING LAB STUDIES:  None.   DURATION OF DISCHARGE/ENCOUNTER:  Forty minutes including physician  time.      Nicolasa Ducking, ANP      Veverly Fells. Excell Seltzer, MD  Electronically Signed    CB/MEDQ  D:  11/15/2007  T:  11/16/2007  Job:  [id number]   cc:   Elsie Stain [facility]

## 2010-09-29 NOTE — Assessment & Plan Note (Signed)
[facility]                            CARDIOLOGY OFFICE NOTE   [name], [name]                     MRN:          [id number]  DATE:11/07/2007                            DOB:          1951/02/19    [name] returns for followup at the [facility] cardiology office on  November 07, 2007.  [name] is a very nice 60 year old gentleman who was  hospitalized at [facility] from June 2 through June 5 with a non-ST-  elevation MI.  He had severe stenosis with subtotal occlusion of the  right coronary artery.  He was treated with overlapping Promus drug-  eluting stents, with a nice angiographic result.  I had planned on a  staged intervention of his left circumflex at the time of that  hospitalization.  However, he had a low-grade fever for several days  following the procedure.  His cultures, urinalysis, and chest x-ray were  all unrevealing.  I elected to discharge him home and perform outpatient  interventions since he was having no angina at the time.   From a symptomatic standpoint [name] is doing well.  He currently  denies chest pain, dyspnea, orthopnea, PND, or edema.  He has had a  cough, but this is chronic and unchanged.  [name] has run out of his  Plavix.  His last dose was taken 4 days ago.  He is in the process of  completing paperwork to get his medications through the [facility].   CURRENT MEDICATIONS:  1. Aspirin 325 mg daily.  2. Metoprolol 25 mg t.i.d.  3. Zocor 40 mg at bedtime.  4. Lisinopril 10 mg daily.  5. Plavix 75 mg daily, where he has recently run out.   ALLERGIES:  NKDA.   PHYSICAL EXAMINATION:  GENERAL:  The patient is alert and oriented.  He  is in no acute distress.  VITAL SIGNS:  Weight 178 pounds, blood pressure 164/100, heart rate 65,  respiratory rate 12.  HEENT:  Normal.  NECK:  Normal carotid upstrokes without bruits.  Jugular venous pressure  is normal.  LUNGS:  Clear to auscultation  bilaterally.  HEART:  Regular rate and rhythm without murmurs or gallops.  ABDOMEN:  Soft, nontender, no organomegaly.  No abdominal bruits.  EXTREMITIES:  No clubbing, cyanosis, or edema.  Peripheral pulses 2+ and  equal throughout.   ASSESSMENT:  1. Coronary artery disease status post recent percutaneous coronary      intervention to the right coronary artery with overlapping drug-      eluting stents.  [name] is doing fine from a symptomatic      standpoint and is having no angina.  I am concerned that he is not      taking Plavix at present.  I urged him to go to [facility] and take      care of his paperwork that he needs to obtain the medicine.  He      thinks he can do this in the next day or two.  He was given 12      Plavix pills today  in the office, and I have asked him to take 2      pills now followed by one daily thereafter.  He will be set up for      staged intervention of the left circumflex next week as previously      planned.  I would like to make sure that the issue with obtaining      his Plavix is all taken care of prior to his PCI procedure next      week.  Otherwise, we will continue his current medical therapy.      See below for further discussion.  2. Hypertension with suboptimal control.  His lisinopril was increased      today from 10 mg to 20 mg.  He should continue metoprolol 25 mg      b.i.d.  We will evaluate his blood pressure again when he is      hospitalized for percutaneous coronary intervention next week.  3. Dyslipidemia.  Continue Zocor 40 mg, follow-up lipids and liver      function tests in 8 weeks.   As detailed above, we will see [name] for staged PCI of severe  stenosis in the left circumflex and OM branches next week.  He will  likely require 2 more stents in those areas.  Further followup pending  his clinical course.     Andrew Fells. Excell Seltzer, MD  Electronically Signed    MDC/MedQ  DD: 11/07/2007  DT: 11/08/2007  Job #: [id number]

## 2010-09-29 NOTE — Assessment & Plan Note (Signed)
[facility]                            CARDIOLOGY OFFICE NOTE   ARVIL, UTZ                     MRN:          [id number]  DATE:11/27/2007                            DOB:          December 30, 1950    HISTORY OF PRESENT ILLNESS:  Roni Scow returns for followup at the  [facility] Cardiology Office on November 27, 2007.  He is a 60 year old  gentleman with coronary artery disease.  He presented with a non-ST  elevation MI in early June and was treated with overlapping drug-eluting  stents in his right coronary artery.  He had a long area of critical  stenosis throughout the vessel.  He underwent a staged intervention of  severe stenosis in the left circumflex on November 14, 2007.  He tolerated  this procedure well and has had a single drug-eluting stent placed in  the proximal left circumflex.  He has been able to obtain his Plavix  through [employer].  He is tolerating his medications well.  He has been able to walk for approximately 20 minutes on a daily basis  without symptoms.  He specifically denies chest pain, dyspnea,  lightheadedness, syncope, palpitations, orthopnea, or PND.  He has no  edema.  He has noted high blood pressure readings when checked at the  pharmacy.   CURRENT MEDICATIONS:  1. Aspirin 325 mg daily.  2. Zocor 40 mg at bedtime.  3. Plavix 75 mg daily.  4. Lopressor 50 mg twice daily.  5. Lisinopril 20 mg daily.  6. Hydrochlorothiazide 12.5 mg daily.   PHYSICAL EXAMINATION:  VITAL SIGNS:  Weight is 188 pounds.  Blood  pressure is 170/100, heart rate 64, respiratory rate 16.  HEENT:  Normal.  NECK:  Normal carotid upstrokes without bruits.  Jugular venous pressure  is normal.  LUNGS:  Clear to auscultation bilaterally.  HEART:  Regular rate and rhythm without murmurs or gallops.  ABDOMEN:  Soft, nontender, no organomegaly.  EXTREMITIES:  No clubbing, cyanosis, or edema.  Peripheral pulses 2+ and  equal  throughout.   ASSESSMENT:  1. Coronary artery disease status post percutaneous coronary      intervention of the right coronary artery and left circumflex as      outlined above.  The patient has been treated with multiple drug-      eluting stents.  Continue dual antiplatelet therapy with aspirin      and Plavix for a minimum of 12 months.  Continue secondary risk      reduction measures with treatment of dyslipidemia and hypertension.  2. Hypertension with suboptimal control.  Mr. Arave has longstanding      hypertension and clearly needs better blood pressure control.  I      have changed his beta-blocker from Lopressor to Coreg at a dose of      12.5 mg twice daily.  I have also asked him to start Norvasc 5 mg      daily.  We will follow up in 4 weeks to assess his blood pressure      response.  3. Dyslipidemia, treated with simvastatin.  Lipids from his initial      presentation showed a      cholesterol of 160, HDL 51, LDL 91.  He will be due for a followup      lipids and LFTs in September.     Veverly Fells. Excell Seltzer, MD  Electronically Signed    MDC/MedQ  DD: 11/27/2007  DT: 11/27/2007  Job #: [id number]

## 2010-09-29 NOTE — Consult Note (Signed)
NAME:  [name], [name] NO.:  [id number]   MEDICAL RECORD NO.:  [id number]          PATIENT TYPE:  EMS   LOCATION:  MAJO                         FACILITY:  [facility]   PHYSICIAN:  Rollene Rotunda, MD, FACCDATE OF BIRTH:  04-07-1951   DATE OF CONSULTATION:  09/03/2008  DATE OF DISCHARGE:  09/03/2008                                 CONSULTATION   PRIMARY CARDIOLOGIST:  Veverly Fells. Excell Seltzer, MD   PRIMARY CARE PHYSICIANS:  At the [facility].   CHIEF COMPLAINT:  Chest pain.   HISTORY OF PRESENT ILLNESS:  A 60 year old male with known history of  CAD (S/P N-STEMI and PCI with three times DES to RCA and one time DES to  circumflex in June 2009), hypertension, hyperlipidemia, remote 50-pack  year smoking history (quit 2007) and ongoing  questionable EtOH abuse up  to 16 drinks per week per patient presenting with very atypical chest  pain.  The patient was in his usual state of health until last Saturday,  August 31, 2008, when he noted mild left upper chest/upper arm pain.  Pain is worse with use of his left upper extremity, deep inhalation,  sitting up, and or movement in general.  Pain is relieved by lying back.  Pain is 10/10 at worst and constant, although it has been waxing and  waning over the last 3 days.  However, generally worsening.  The patient  reports similar episode in the past with spontaneous resolution of  symptoms.  Symptoms are significantly different from prior anginal  symptoms.  No relief with NSAIDs or nitroglycerin.  The patient's wife  was set up with his complaints and insisted that he presents to the ED  for eval today.   PAST MEDICAL HISTORY:  1. CAD/N-STEMI June 2009 with PCI, three times PROMUS (DES) to RCA,      overlapping and staged PCI with Cypher (DES) to proximal left      circumflex, and residual OM/intermediate disease treated medically.  2. Hypertension.  3. Hyperlipidemia.  4. Remote tobacco abuse (quit 2007).  5. EtOH abuse  up to 16 drinks per week per patient.  6. Emphysema.   SOCIAL HISTORY:  The patient lives in [place] with his wife and son.  He is on disability, a remote 50 pack-year smoking history, three to  four drinks  per day, three to four times per week per patient cut down  from previous levels of intake drug use.  No herbal medications.  Eats a  heart healthy diet until the last week.  He would walk 10-15 minutes  daily without any symptoms.  Note the patient reports walking uphill  without any significant shortness of breath as long as duration longer  than 15 minutes.   FAMILY HISTORY:  Mother deceased at age 13 from massive MI.  Father  deceased at age 48 from pneumonia.  No known history of CAD.  Two  siblings, both sisters living without history of CAD.   REVIEW OF SYSTEMS:  As described in HPI plus increased stress over the  last week due to financial concerns.  Also bright  red blood per rectum  very minimal and noted on toilet paper intermittently.  Mild abdominal  pain also which has been relieved with antacids over the last several  weeks.  All other systems reviewed and negative.   ALLERGIES:  NKDA.   MEDICATIONS:  1. Aspirin 325 mg p.o. daily.  2. Zocor 40 mg p.o. at bedtime.  3. Plavix 75 mg p.o. daily.  4. Lisinopril 20 mg p.o. daily.  5. Hydrochlorothiazide 12.5 mg p.o. daily.  6. Norvasc 5 mg p.o. daily.  7. Coreg 12.5 mg p.o. daily (per patient 100% excellent compliance).   PHYSICAL EXAMINATION:  VITAL SIGNS:  Temperature 98.0 degrees  Fahrenheit, BP 140/81, pulse 77, respiration rate 20, O2 saturation 97%  on room air.  GENERAL:  The patient is alert and oriented x3 in no  apparent distress, able to speak easily without respiratory distress.  The patient with mild wincing during exam when sitting up.  HEAD:  Normocephalic, atraumatic.  Pupils equal, round, and reactive to  light.  Extraocular muscles are intact.  Nares patent without discharge.  Dentition is fair  with some missing teeth front lower section.  Oropharynx without erythema or exudates.  NECK:  Supple without lymphadenopathy.  No thyromegaly.  No JVD.  HEART:  Regular rate with audible S1-S2.  No clicks, rubs, murmurs or  gallops.  Pulses are 2+ and equal in both upper and lower extremities  bilaterally.  LUNGS:  Clear to auscultation bilaterally.  No rash.  SKIN:  No rashes, lesions or petechiae.  ABDOMEN:  Soft, nontender, nondistended.  Normal abdominal bowel sounds.  No rebound or guarding.  No hepatosplenomegaly.  EXTREMITIES:  No  clubbing, cyanosis or edema.  MUSCULOSKELETAL:  No joint deformity or effusions.  No spinal or CVA  tenderness.  Left upper chest is tender to palpation.  NEURO:  Cranial  nerves II through XII grossly intact.  Strength 5/5 in all extremities  and axial groups.  Normal sensation throughout and normal cerebellar  function.   RADIOLOGY:  Chest x-ray showed hyperinflation consistent with  obstructive pulmonary disease with areas of fibrotic change.  No  pulmonary edema, pneumonia or pleural effusions.  EKG shows sinus rhythm  with a rate of 76 bpm.  Normal axis.  No evidence of hypertrophy, some T-  wave inversion in V1 and V8 that is different from prior EKG performed  on November 15, 2007 but no significant EKG changes.  PR 196, QRS 98, QTC  477.   LABORATORY DATA:  WBC 5.3, HGB 12.8, HCT 39.1, PLT count 231.  Sodium  139, potassium 3.5, chloride 104, CO2 25, BUN 15, creatinine 1.26,  glucose 156.  D-dimer is minimally elevated at 0.49.  PT 13.8, INR 1.0,  calcium 9.4.  First set of cardiac enzymes negative with CK 71, CK-MB  0.7, troponin I less than 0.01.   ASSESSMENT AND PLAN:  This is a 60 year old male with a known history of  coronary artery disease (status post non-ST segment myocardial  infarction and pecutaneous coronary intervention x2, three times drug-  eluting stent to right coronary artery, three times overlapping drug-  eluting stent to  right coronary artery and one times drug-eluting stent  to proximal circumflex), hypertension, hyperlipidemia, remote 50 pack-  year tobacco abuse disorder quitting in 2007, and continued ethyl  alcohol (consumption, dependency) use/abuse up to 16 drinks last week  presenting with very atypical chest pain and new T-wave inversion in V1  and V2 and in anterior leads.  1. Chest pain very atypical and only objective evidence of ischemia      are T-wave inversion in V1-V2.  D-dimer is elevated at 0.1 above      the normal limits but no risk factors and no other objective      evidence of thromboembolism (nontachycardic nor reporting shortness      of breath).  Also no evidence of pericarditis (no rub on exam) nor      hypertensive.  We will discuss the patient in depth with Dr.      Antoine Poche and he will examine the patient as well as review my note.      Almost there is a change to this assessment and plan.  The patient      will be      given a one time dose of Toradol 30 mg intramuscular and instructed      to take ibuprofen 400 mg p.o. q.6 hours and follow up in our office      for echocardiogram and also with his primary care physician within      the next few days or as soon as possible.  Please see addendum to      this consult or changes per Dr. Antoine Poche.      Jarrett Ables, [facility]      Rollene Rotunda, MD, [facility]  Electronically Signed    MS/MEDQ  D:  09/03/2008  T:  09/04/2008  Job:  [id number]

## 2010-09-29 NOTE — Discharge Summary (Signed)
NAME:  [name], [name] NO.:  [id number]   MEDICAL RECORD NO.:  [id number]          PATIENT TYPE:  INP   LOCATION:  2034                         FACILITY:  [facility]   PHYSICIAN:  Veverly Fells. Excell Seltzer, MD  DATE OF BIRTH:  [birth year]   DATE OF ADMISSION:  10/17/2007  DATE OF DISCHARGE:  10/20/2007                               DISCHARGE SUMMARY   PRIMARY CARDIOLOGIST:  Tonny Bollman, MD   PRIMARY CARE PHYSICIAN:  [facility]icians at [facility].   PROCEDURES PERFORMED DURING HOSPITALIZATION.:  1. Cardiac catheterization completed by Dr. Tonny Bollman.      a.     Severe diffuse right coronary artery stenosis (culprit       vessel), severe left circumflex stenosis nonobstructive LAD       stenosis, preserved overall LV function with LVEF of 55%.  2. Successful PCI of the right coronary artery using 3 overlapping      Promise drug-eluting stents.   FINAL DISCHARGE DIAGNOSIS:  1. Non-ST elevated myocardial infarction.      a.     Status post cardiac catheterization, percutaneous coronary       intervention to the right coronary artery.      b.     Staged left circumflex percutaneous coronary intervention in       2 weeks after being seen by Dr. Excell Seltzer.  2. Fever of unknown origin.  3. Hypertension.  4. History of ethyl alcohol and tobacco abuse.  5. Hypercholesterolemia.   HOSPITAL COURSE:  This is a 60 year old African-American male with no  prior cardiac history who was in his usual state of health until  approximately 1 week prior to admission where began experiencing  exertional chest pain and dyspnea lasting for 5 minutes, relief with  rest.  The patient states that it was occurring at rest just prior to  admission with multiple recurrences during the day.  The patient did  have some radiation to his right arm after experiencing severe pain the  night before admission along with associated restlessness.  The patient  called EMS and was found to have an abnormal EKG  with inferior Q-waves  and 1-3 mm ST elevation noted to in V1-V4 with ST downsloping in V5-V6.  The patient was seen in the emergency room as a result of this.   After evaluation by Dr. Arvilla Meres on evaluation in emergency room  and found that the patient should be admitted for cycle cardiac enzymes  and the patient's with the plan for cardiac catheterization.  The  patient was started on nitroglycerin, heparin, aspirin, Plavix and  cardiac catheterization was completed on day of admission.   The patient did undergo cardiac catheterization per Dr. Tonny Bollman  with results as discussed above.  The patient did have 3 overlapping  Promise drug-eluting stents placed to the right coronary artery as this  was a culprit vessel.  However, there was also evidence of stenosis in  the circumflex with a staged PCI planned in approximately 2 weeks after  infection status had been assessed as the patient had a low-grade  temperature throughout  hospitalization of 99.9.  The patient's cardiac  enzymes were cycled, troponin initially was 3.14 and decreased to 2.57.  The patient was also found to have hypercholesterolemia with a LDL of  91, total cholesterol of 160 with a triglyceride of 87.  The patient was  started on simvastatin.  He was also placed on beta-blocker and Plavix  along with an ACE inhibitor.   The patient was monitored overnight in ICU and then transferred to  telemetry where he was up walking with cardiac rehab without any  complaints of chest discomfort.  The patient has a normally wobbly gait  due to degenerative joint disease, but was able to ambulate without  falling or miss-stepping .   The patient was seen and exam on the day of discharge by Dr. Tonny Bollman and found to be stable.  He had a urinalysis completed which was  found to be negative for UTI.  We will wait to follow-up with stage PCI  after the patient's temperature normalized.  His last temperature on   discharge was 100.1.  There was no evidence of positive blood cultures  or infection seen.  The patient's white blood cells were checked and  found to be 6.0 and no elevation throughout hospitalization.  The  patient did have an ultrasound evaluation of his right groin and was no  evidence for pseudoaneurysm, AV fistula, or hematoma.   On day of discharge, the patient's was found to be stable although he  continued to have a low-grade temperature of 100.1.  There were no other  signs of infection, as stated urinalysis and right groin ultrasound were  negative.  The patient will follow-up with Dr. Tonny Bollman in  approximately 2 weeks with a follow-up CBC to be completed before office  visit.  The patient will go home on Plavix, aspirin, beta-blocker and  ACE inhibitor.  The patient will discuss with Dr. Excell Seltzer follow-up  staged procedure to the left circumflex at his discretion after he is  seen in the office.  The patient has been given a prescription for a  supply of Plavix for 14 days for free Plavix until being seen at the [facility] pharmacy for filled billing these prescriptions and as an  additional prescription for Plavix was completed to be is attached to  drug assistant form along with other medications.   DISCHARGE LABS:  Hemoglobin 11.7, hematocrit 36.6, white blood cells  6.0, platelets 224, sodium 137, potassium 3.9, chloride 103, CO2 25, BUN  5, creatinine 0.87, glucose 106.  The patient had normal LFTs.  Troponin  3.14 and 2.57.  Cholesterol study as discussed above.   DISCHARGE MEDICATIONS:  1. Plavix 75 mg daily (extra prescription provided for drug assistant      form along with 14-day supply to be attached to coupon for free      medication).  2. Aspirin 325 daily.  3. Metoprolol 25 mg twice a day.  4. Zocor 40 mg at bedtime.  5. Lisinopril 10 mg daily.  6. Nitroglycerin 0.4 mg p.r.n. chest discomfort.   ALLERGIES:  No known drug allergies.   FOLLOW-UP  PLANS AND APPOINTMENT:  1. The patient will see Dr. Tonny Bollman on November 07, 2007, at 2:15      p.m. for discussion of stage PCI to the left circumflex.  2. The patient will come to the office on November 02, 2007, for a CBC to      be drawn to evaluate white  blood cells.  3. The patient has been given prescriptions for post cardiac      catheterization activity and to notice right groin site      for evidence of bleeding, hematoma, or infection.  4. The patient has been counseled on smoking cessation.   Time spent with the patient to include physician time 40 minutes.      Bettey Mare. Lyman Bishop, NP      Veverly Fells. Excell Seltzer, MD  Electronically Signed    KML/MEDQ  D:  10/20/2007  T:  10/20/2007  Job:  [id number]

## 2010-09-29 NOTE — Assessment & Plan Note (Signed)
[facility]                            CARDIOLOGY OFFICE NOTE   LYNETTE, NOAH                     MRN:          [id number]  DATE:12/29/2007                            DOB:          11-30-1950    [name] is a 60 year old gentleman with coronary artery disease  who presented for followup on December 29, 2007.  He initially presented  with a non-ST-elevation MI in June of this year and was treated with  overlapping drug-eluting stents in the right coronary artery.  He  underwent staged intervention of the left circumflex on November 14, 2007,  to treat severe stenosis in that vessel.  He is really doing very well  from a symptomatic standpoint at present.  At the time of his last  followup, he had markedly elevated blood pressure and adjustments to his  antihypertensive regimen were made.  His activity level is good and he  denies chest pain, dyspnea, edema, orthopnea, PND, or lightheadedness.  He complains of erectile dysfunction since starting on his medical  regimen.  Prior to his initial event, he was taking no medicines.   CURRENT MEDICATIONS:  1. Aspirin 325 mg daily.  2. Zocor 40 mg at bedtime.  3. Plavix 75 mg daily.  4. Lisinopril 20 mg daily.  5. Hydrochlorothiazide 12.5 mg daily.  6. Norvasc 5 mg daily.  7. Coreg 12.5 mg twice daily.   ALLERGIES:  NKDA.   PHYSICAL EXAMINATION:  GENERAL:  The patient is alert and oriented.  He  is in no acute distress.  VITAL SIGNS:  Weight is 184 pounds, blood pressure 130/78, heart rate  68, and respiratory rate 12.  HEENT:  Normal.  NECK:  Normal carotid upstrokes without bruits.  JVP normal.  LUNGS:  Clear bilaterally.  HEART:  Regular rate and rhythm.  No murmurs or gallops.  ABDOMEN:  Soft and nontender.  No organomegaly.  EXTREMITIES:  No clubbing, cyanosis, or edema.  Peripheral pulses are  intact and equal bilaterally.   ASSESSMENT:  1. Coronary artery disease.  The patient has  preserved left      ventricular function.  He is currently angina-free.  Continue his      current regimen, which includes dual antiplatelet therapy with      aspirin and Plavix.  When I see him back in 4 months, I will plan      on reducing his aspirin dose to 81 mg.  Continue secondary risk      reduction measures as outlined below.  2. Essential hypertension.  Continues on multidrug combination with      lisinopril, hydrochlorothiazide, Norvasc, and Coreg.  His blood      pressure is under ideal control at present.  3. Dyslipidemia.  The patient is on simvastatin 40 mg.  His lipids      from his initial hospitalization showed cholesterol 160, HDL 51,      LDL 91, and triglycerides 89.  We will follow up lipids and liver      function tests at the time of his return office visit.  4. Erectile dysfunction.  I suspect this is related to his      antihypertensive medications.  He was written a prescription for      Cialis 10 mg and warned of the contraindication with concomitant      nitrate use.   FOLLOWUP:  I will see [name] back in 4 months with lipids and LFTs.      Veverly Fells. Excell Seltzer, MD  Electronically Signed    MDC/MedQ  DD: 01/04/2008  DT: 01/04/2008  Job #: [id number]

## 2010-09-29 NOTE — H&P (Signed)
NAME:  LISLE, SKILLMAN NO.:  [id number]   MEDICAL RECORD NO.:  [id number]          PATIENT TYPE:  EMS   LOCATION:  [facility]                         FACILITY:  [facility]   PHYSICIAN:  Veverly Fells. Excell Seltzer, MD  DATE OF BIRTH:  [birth year]   DATE OF ADMISSION:  10/17/2007  DATE OF DISCHARGE:                              HISTORY & PHYSICAL   PRIMARY CARE Jazzalynn Rhudy:  Does not have one, although he is infrequently  seen at the [facility].   PRIMARY CARDIOLOGIST:  [place] [facility].   PATIENT'S PROFILE:  A 60 year old African American male without prior  history of CAD who presents to the ED following a 1-week history of  progressive exertional and now rest angina that was worse throughout the  night last night.   PROBLEMS:  1. Acute MI  2. Hypertension, untreated.  3. Cervical degenerative joint disease, status post surgery at the      [facility] about 20 years ago.  4. Remote tobacco abuse with a 50-pack-year history, quitting in 2007.  5. EtOH abuse, currently drinking a 6-12 pack of beer per day.   HISTORY OF PRESENT ILLNESS:  A 60 year old African American male without  prior history of CAD.  He was in his usual state of health until  approximately 1 week ago when he began to experience exertional chest  pain and dyspnea lasting approximately 5 minutes and resolving with  rest.  By Oct 14, 2007, his symptoms were occurring at rest, multiple  times per day and lasting 1-2 hours at a time and also became associated  with right arm numbness at times.  Severity of pain was worse on Oct 15, 2007, as well as October 16, 2007, and last night, he had 10/10 discomfort  with radiation to his right arm, diaphoresis, and dyspnea.  He was  restless throughout the night and slept very little.  He woke up this  morning with continued symptoms and he presented to the [facility] ED.  Here his ECG shows inferior Qs with 1-3 mm ST elevation in V1-V4 with  probable LVH and some downsloping of ST-segment depression in V5 and V6.  First troponin by point of care was 0.14 and second was 0.122.  He  appears comfortable but still reports forehead and chest discomfort,  which has significantly improved since presentation.  He has been  treated with heparin, nitroglycerin, aspirin, Plavix, and beta-blocker  at this point and is awaiting transport to the cath lab.   ALLERGIES:  No known drug allergies.   HOME MEDICATIONS:  None.   FAMILY HISTORY:  Mother died at 45 of an MI and heart failure.  Father  died in his late 30s of ammonia.  He has  2 sisters who are alive and  well.   SOCIAL HISTORY:  Lives in [place] with his wife.  He is a disabled  Cytogeneticist.  He previously smoked about a pack and half a day for 30 years  and quit in 2007.  He continues to drink a 6-12-pack of beer per day.  He uses  marijuana few times in months and last use was a week ago.   REVIEW OF SYSTEMS:  Positive for chest pain, shortness breath, dyspnea  on exertion and all as outlined in HPI.  He has also had some weakness  since Saturday and intermittent diaphoresis associated with his chest  discomfort.  Otherwise, all systems reviewed and negative.   PHYSICAL EXAMINATION:  VITAL SIGNS:  Temperature 97.7, heart rate 96,  respirations 20, blood pressure 137/95, and pulse ox 96% on room air.  GENERAL:  He is an [place] American male, in no acute distress.  Awake,  alert, or oriented x3.  HEENT:  Normal.  Nares grossly intact, nonfocal.  SKIN:  Warm and dry without lesions or masses.  NECK:  No bruits or JVD.  LUNGS:  Respirations regular, unlabored.  Clear to auscultation.  CARDIAC:  Regular S1, S2.  No S3, S4 murmurs.  ABDOMEN:  Round, soft, nontender, and nondistended.  Bowel sounds  present x4.  EXTREMITIES: Warm and dry.  No clubbing, cyanosis, or edema.  Dorsalis  pedis and posterior tibial pulse 2+ and equal bilaterally.  No femoral  bruits are noted.    Chest x-ray showed bolus emphysema with no acute process.  EKG shows  sinus rhythm with a rate of 90, left axis deviation, LVH, inferior Qs, 1-  3 mm ST elevation in V1-V4 with downsloping ST depression of 1 mm in V5  and V6.  This improved somewhat on the second ECG.   LABORATORY DATA:  Hemoglobin 13.0, hematocrit 40.3, WBC 6.6, and  platelets 262.  Sodium 138, potassium 4.7, chloride 105, CO2 of 25, BUN  7, creatinine 1.01, and glucose 111.  D-dimer 0.47, BNP 54, CK-MB is now  5.0 with an MB of 0.18, and INR 1.0.   ASSESSMENT AND PLAN:  1. Acute myocardial infarction.  Pain significantly improved with      nitroglycerin, heparin, aspirin, and beta-blocker.  He has already      received 300 of Plavix here in the emergency department.  We will      plan to add statin and ACE inhibitor.  Plan cath this morning.  2. Hypertension.  Previously untreated, follow on beta-blocker and      ACE.  3. Lipid status, currently unknown.  Check lipids and liver function      tests.  Add high-dose statin.  4. Ethyl alcohol abuse, deep vein thrombosis prophylaxis.  Cessation      advised.  5. Tobacco abuse, quit 2 years ago.  Continued cessation advised.      Nicolasa Ducking, ANP      Veverly Fells. Excell Seltzer, MD  Electronically Signed    CB/MEDQ  D:  10/17/2007  T:  10/18/2007  Job:  [id number]

## 2010-10-08 ENCOUNTER — Telehealth: Payer: Self-pay | Admitting: Cardiovascular Disease

## 2010-10-08 DIAGNOSIS — E7800 Pure hypercholesterolemia, unspecified: Secondary | ICD-10-CM

## 2010-10-08 NOTE — Telephone Encounter (Signed)
Pt rtn call to lauren=ok to leave message on machine

## 2010-10-09 MED ORDER — ATORVASTATIN CALCIUM 40 MG PO TABS: 40.0000 mg | ORAL_TABLET | Freq: Every day | ORAL | Status: DC

## 2010-10-09 NOTE — Telephone Encounter (Signed)
I spoke with the pt and made him aware of 09/02/10 lab results.  The pt would like to switch to Atorvastatin 40mg  daily.  I will send in a Rx to [facility] on [address].  The pt will be due for a repeat lipid and liver the week of 01/19/11.

## 2010-10-30 ENCOUNTER — Other Ambulatory Visit: Payer: Self-pay | Admitting: Cardiovascular Disease

## 2011-01-19 ENCOUNTER — Other Ambulatory Visit: Payer: Medicare Other | Admitting: *Deleted

## 2011-02-11 LAB — COMPREHENSIVE METABOLIC PANEL
ALT: 15
ALT: 17
AST: 42 — ABNORMAL HIGH
Albumin: 3.6
Alkaline Phosphatase: 44
BUN: 5 — ABNORMAL LOW
BUN: 7
CO2: 25
GFR calc non Af Amer: >60
Glucose, Bld: 106 — ABNORMAL HIGH
Glucose, Bld: 111 — ABNORMAL HIGH
Potassium: 3.9
Potassium: 4.7
Total Bilirubin: 0.8
Total Protein: 6.6

## 2011-02-11 LAB — CARDIAC PANEL(CRET KIN+CKTOT+MB+TROPI)
CK, MB: 5.7 — ABNORMAL HIGH
Total CK: 188
Total CK: 215
Troponin I: 3.14

## 2011-02-11 LAB — BASIC METABOLIC PANEL
BUN: 8
BUN: 10
Calcium: 9.1
Creatinine, Ser: 0.85
Creatinine, Ser: 0.85
GFR calc Af Amer: >60
GFR calc Af Amer: >60
GFR calc non Af Amer: >60
GFR calc non Af Amer: >60
GFR calc non Af Amer: >60
Glucose, Bld: 104 — ABNORMAL HIGH
Potassium: 4
Potassium: 4.1
Potassium: 4
Sodium: 138

## 2011-02-11 LAB — URINE CULTURE
Colony Count: NO GROWTH
Culture: NO GROWTH

## 2011-02-11 LAB — CBC
HCT: 35.7 — ABNORMAL LOW
HCT: 36.3 — ABNORMAL LOW
HCT: 40.3
Hemoglobin: 11.7 — ABNORMAL LOW
Hemoglobin: 12 — ABNORMAL LOW
Hemoglobin: 13
Hemoglobin: 12.6 — ABNORMAL LOW
MCHC: 33.1
MCV: 85.9
Platelets: 247
Platelets: 246
RBC: 4.27
RBC: 4.29
RBC: 4.71
RBC: 4.65
RDW: 15.8 — ABNORMAL HIGH
RDW: 15.9 — ABNORMAL HIGH
RDW: 16 — ABNORMAL HIGH
RDW: 14.9
WBC: 5.8
WBC: 6
WBC: 5.3
WBC: 5

## 2011-02-11 LAB — B-NATRIURETIC PEPTIDE (CONVERTED LAB): Pro B Natriuretic peptide (BNP): 54

## 2011-02-11 LAB — URINALYSIS, ROUTINE W REFLEX MICROSCOPIC
Bilirubin Urine: NEGATIVE
Ketones, ur: NEGATIVE
Nitrite: NEGATIVE
Protein, ur: NEGATIVE
pH: 6

## 2011-02-11 LAB — POCT CARDIAC MARKERS
CKMB, poc: 4.2
CKMB, poc: 5
Myoglobin, poc: 67
Myoglobin, poc: 68.7
Operator id: 294521
Troponin i, poc: 0.18 — ABNORMAL HIGH
Troponin i, poc: 0.22 — ABNORMAL HIGH

## 2011-02-11 LAB — DIFFERENTIAL
Eosinophils Absolute: 0
Eosinophils Relative: 1
Lymphocytes Relative: 33
Lymphs Abs: 2.2
Monocytes Absolute: 1

## 2011-02-11 LAB — APTT: aPTT: 28

## 2011-02-11 LAB — LIPID PANEL
Cholesterol: 160
HDL: 51
Total CHOL/HDL Ratio: 3.1
VLDL: 18

## 2011-02-11 LAB — PROTIME-INR
INR: 1
Prothrombin Time: 13.7
Prothrombin Time: 13.3

## 2011-02-12 ENCOUNTER — Other Ambulatory Visit: Payer: Self-pay | Admitting: Cardiovascular Disease

## 2011-02-17 LAB — DIFFERENTIAL
Basophils Relative: 1
Eosinophils Relative: 2
Monocytes Absolute: 0.6
Monocytes Relative: 14 — ABNORMAL HIGH
Neutro Abs: 1.9

## 2011-02-17 LAB — CBC
HCT: 37.2 — ABNORMAL LOW
Hemoglobin: 11.9 — ABNORMAL LOW
MCHC: 31.9
MCV: 83.2
RBC: 4.47

## 2011-02-17 LAB — POCT CARDIAC MARKERS
CKMB, poc: 2.3
Myoglobin, poc: 68.1

## 2011-02-17 LAB — POCT I-STAT, CHEM 8
Calcium, Ion: 1.16
Chloride: 106
Glucose, Bld: 87
HCT: 39

## 2011-02-23 ENCOUNTER — Ambulatory Visit: Payer: Medicare Other | Admitting: Cardiovascular Disease

## 2011-02-23 LAB — I-STAT 8, (EC8 V) (CONVERTED LAB)
Acid-base deficit: 2
Potassium: 4.4
TCO2: 25
pCO2, Ven: 39.4 — ABNORMAL LOW
pH, Ven: 7.381 — ABNORMAL HIGH

## 2011-02-23 LAB — POCT I-STAT CREATININE
Creatinine, Ser: 1.1
Operator id: 270111

## 2011-03-26 ENCOUNTER — Ambulatory Visit: Payer: Medicare Other | Admitting: Cardiovascular Disease

## 2011-04-19 ENCOUNTER — Other Ambulatory Visit: Payer: Self-pay

## 2011-04-19 ENCOUNTER — Encounter (HOSPITAL_COMMUNITY): Payer: Self-pay

## 2011-04-19 ENCOUNTER — Inpatient Hospital Stay (HOSPITAL_COMMUNITY)
Admission: EM | Admit: 2011-04-19 | Discharge: 2011-04-23 | DRG: 287 | Disposition: A | Discharge status: Home / Self Care | Payer: Medicare Other | Source: Ambulatory Visit | Attending: Internal Medicine | Admitting: Internal Medicine

## 2011-04-19 ENCOUNTER — Emergency Department (HOSPITAL_COMMUNITY): Payer: Medicare Other

## 2011-04-19 DIAGNOSIS — Z79899 Other long term (current) drug therapy: Secondary | ICD-10-CM

## 2011-04-19 DIAGNOSIS — Z87891 Personal history of nicotine dependence: Secondary | ICD-10-CM

## 2011-04-19 DIAGNOSIS — M109 Gout, unspecified: Secondary | ICD-10-CM | POA: Insufficient documentation

## 2011-04-19 DIAGNOSIS — E7800 Pure hypercholesterolemia, unspecified: Secondary | ICD-10-CM | POA: Diagnosis present

## 2011-04-19 DIAGNOSIS — D72829 Elevated white blood cell count, unspecified: Secondary | ICD-10-CM | POA: Diagnosis not present

## 2011-04-19 DIAGNOSIS — R079 Chest pain, unspecified: Secondary | ICD-10-CM

## 2011-04-19 DIAGNOSIS — I10 Essential (primary) hypertension: Secondary | ICD-10-CM | POA: Diagnosis present

## 2011-04-19 DIAGNOSIS — R0789 Other chest pain: Principal | ICD-10-CM | POA: Diagnosis present

## 2011-04-19 DIAGNOSIS — J438 Other emphysema: Secondary | ICD-10-CM | POA: Diagnosis present

## 2011-04-19 DIAGNOSIS — I252 Old myocardial infarction: Secondary | ICD-10-CM

## 2011-04-19 DIAGNOSIS — Z7902 Long term (current) use of antithrombotics/antiplatelets: Secondary | ICD-10-CM

## 2011-04-19 DIAGNOSIS — Z9861 Coronary angioplasty status: Secondary | ICD-10-CM

## 2011-04-19 DIAGNOSIS — E876 Hypokalemia: Secondary | ICD-10-CM

## 2011-04-19 DIAGNOSIS — Z8249 Family history of ischemic heart disease and other diseases of the circulatory system: Secondary | ICD-10-CM

## 2011-04-19 DIAGNOSIS — E785 Hyperlipidemia, unspecified: Secondary | ICD-10-CM | POA: Diagnosis present

## 2011-04-19 DIAGNOSIS — Z7982 Long term (current) use of aspirin: Secondary | ICD-10-CM

## 2011-04-19 DIAGNOSIS — M1990 Unspecified osteoarthritis, unspecified site: Secondary | ICD-10-CM | POA: Diagnosis present

## 2011-04-19 DIAGNOSIS — M25579 Pain in unspecified ankle and joints of unspecified foot: Secondary | ICD-10-CM

## 2011-04-19 DIAGNOSIS — I2510 Atherosclerotic heart disease of native coronary artery without angina pectoris: Secondary | ICD-10-CM | POA: Diagnosis present

## 2011-04-19 HISTORY — DX: Pneumonia, unspecified organism: J18.9

## 2011-04-19 HISTORY — DX: Shortness of breath: R06.02

## 2011-04-19 HISTORY — DX: Chronic obstructive pulmonary disease, unspecified: J44.9

## 2011-04-19 HISTORY — DX: Acute myocardial infarction, unspecified: I21.9

## 2011-04-19 HISTORY — DX: Angina pectoris, unspecified: I20.9

## 2011-04-19 LAB — DIFFERENTIAL
Eosinophils Relative: 0 % (ref 0–5)
Lymphocytes Relative: 20 % (ref 12–46)
Lymphs Abs: 1.8 10*3/uL (ref 0.7–4.0)
Monocytes Relative: 16 % — ABNORMAL HIGH (ref 3–12)

## 2011-04-19 LAB — CBC
HCT: 37.2 % — ABNORMAL LOW (ref 39.0–52.0)
Hemoglobin: 12.4 g/dL — ABNORMAL LOW (ref 13.0–17.0)
MCH: 27.3 pg (ref 26.0–34.0)
MCV: 81.9 fL (ref 78.0–100.0)
Platelets: 259 10*3/uL (ref 150–400)
RBC: 4.54 MIL/uL (ref 4.22–5.81)
WBC: 9 10*3/uL (ref 4.0–10.5)

## 2011-04-19 LAB — TROPONIN I: Troponin I: <0.3 ng/mL (ref <0.30)

## 2011-04-19 LAB — BASIC METABOLIC PANEL
CO2: 27 mEq/L (ref 19–32)
Chloride: 102 mEq/L (ref 96–112)
Glucose, Bld: 110 mg/dL — ABNORMAL HIGH (ref 70–99)
Potassium: 3.6 mEq/L (ref 3.5–5.1)
Sodium: 140 mEq/L (ref 135–145)

## 2011-04-19 LAB — APTT: aPTT: 30 seconds (ref 24–37)

## 2011-04-19 LAB — CARDIAC PANEL(CRET KIN+CKTOT+MB+TROPI): Total CK: 101 U/L (ref 7–232)

## 2011-04-19 LAB — PRO B NATRIURETIC PEPTIDE: Pro B Natriuretic peptide (BNP): 609 pg/mL — ABNORMAL HIGH (ref 0–125)

## 2011-04-19 MED ORDER — INFLUENZA VIRUS VACC SPLIT PF IM SUSP
0.5000 mL | INTRAMUSCULAR | Status: AC
  Filled 2011-04-19: qty 0.5

## 2011-04-19 MED ORDER — ASPIRIN 81 MG PO CHEW
324.0000 mg | CHEWABLE_TABLET | ORAL | Status: AC
  Administered 2011-04-20: 324 mg via ORAL
  Filled 2011-04-19: qty 4

## 2011-04-19 MED ORDER — PNEUMOCOCCAL VAC POLYVALENT 25 MCG/0.5ML IJ INJ
0.5000 mL | INJECTION | INTRAMUSCULAR | Status: AC
  Filled 2011-04-19: qty 0.5

## 2011-04-19 MED ORDER — HYDRALAZINE HCL 20 MG/ML IJ SOLN
10.0000 mg | INTRAMUSCULAR | Status: DC | PRN
  Administered 2011-04-20: 10 mg via INTRAVENOUS
  Filled 2011-04-19: qty 0.5

## 2011-04-19 MED ORDER — ASPIRIN 81 MG PO TABS: 81.0000 mg | ORAL_TABLET | Freq: Every day | ORAL | Status: DC

## 2011-04-19 MED ORDER — ROSUVASTATIN CALCIUM 20 MG PO TABS
20.0000 mg | ORAL_TABLET | Freq: Every day | ORAL | Status: DC
  Administered 2011-04-20 – 2011-04-22 (×3): 20 mg via ORAL
  Filled 2011-04-19 (×4): qty 1

## 2011-04-19 MED ORDER — MORPHINE SULFATE 4 MG/ML IJ SOLN
4.0000 mg | Freq: Once | INTRAMUSCULAR | Status: AC
  Administered 2011-04-19: 4 mg via INTRAVENOUS
  Filled 2011-04-19: qty 1

## 2011-04-19 MED ORDER — HEPARIN BOLUS VIA INFUSION
4000.0000 [IU] | Freq: Once | INTRAVENOUS | Status: AC
  Administered 2011-04-19: 4000 [IU] via INTRAVENOUS
  Filled 2011-04-19: qty 4000

## 2011-04-19 MED ORDER — CARVEDILOL 6.25 MG PO TABS
6.2500 mg | ORAL_TABLET | Freq: Two times a day (BID) | ORAL | Status: DC
  Filled 2011-04-19 (×2): qty 1

## 2011-04-19 MED ORDER — NITROGLYCERIN IN D5W 200-5 MCG/ML-% IV SOLN
2.0000 ug/min | INTRAVENOUS | Status: DC
  Administered 2011-04-19: 5 ug/min via INTRAVENOUS
  Filled 2011-04-19: qty 250

## 2011-04-19 MED ORDER — HYDRALAZINE HCL 20 MG/ML IJ SOLN
10.0000 mg | INTRAMUSCULAR | Status: DC
  Administered 2011-04-19: 10 mg via INTRAVENOUS

## 2011-04-19 MED ORDER — ASPIRIN 81 MG PO CHEW
324.0000 mg | CHEWABLE_TABLET | Freq: Once | ORAL | Status: AC
  Administered 2011-04-19: 324 mg via ORAL
  Filled 2011-04-19: qty 4

## 2011-04-19 MED ORDER — TRAMADOL HCL 50 MG PO TABS
50.0000 mg | ORAL_TABLET | Freq: Four times a day (QID) | ORAL | Status: DC | PRN
  Administered 2011-04-19 – 2011-04-22 (×4): 50 mg via ORAL
  Filled 2011-04-19 (×4): qty 1

## 2011-04-19 MED ORDER — SODIUM CHLORIDE 0.9 % IJ SOLN: 3.0000 mL | Freq: Two times a day (BID) | INTRAMUSCULAR | Status: DC

## 2011-04-19 MED ORDER — SODIUM CHLORIDE 0.9 % IJ SOLN: 3.0000 mL | INTRAMUSCULAR | Status: DC | PRN

## 2011-04-19 MED ORDER — SODIUM CHLORIDE 0.9 % IV SOLN: 250.0000 mL | INTRAVENOUS | Status: DC | PRN

## 2011-04-19 MED ORDER — ONDANSETRON HCL 4 MG/2ML IJ SOLN: 4.0000 mg | Freq: Four times a day (QID) | INTRAMUSCULAR | Status: DC | PRN

## 2011-04-19 MED ORDER — NITROGLYCERIN 0.4 MG SL SUBL: 0.4000 mg | SUBLINGUAL_TABLET | SUBLINGUAL | Status: DC | PRN

## 2011-04-19 MED ORDER — ASPIRIN EC 81 MG PO TBEC
81.0000 mg | DELAYED_RELEASE_TABLET | Freq: Every day | ORAL | Status: DC
  Administered 2011-04-19 – 2011-04-23 (×5): 81 mg via ORAL
  Filled 2011-04-19 (×5): qty 1

## 2011-04-19 MED ORDER — LISINOPRIL 40 MG PO TABS
40.0000 mg | ORAL_TABLET | Freq: Every day | ORAL | Status: DC
  Administered 2011-04-19 – 2011-04-23 (×5): 40 mg via ORAL
  Filled 2011-04-19 (×5): qty 1

## 2011-04-19 MED ORDER — CLONIDINE HCL 0.1 MG PO TABS
0.2000 mg | ORAL_TABLET | Freq: Once | ORAL | Status: AC
  Administered 2011-04-19: 0.2 mg via ORAL
  Filled 2011-04-19: qty 2

## 2011-04-19 MED ORDER — DIAZEPAM 5 MG PO TABS: 5.0000 mg | ORAL_TABLET | ORAL | Status: DC

## 2011-04-19 MED ORDER — DIAZEPAM 5 MG PO TABS
5.0000 mg | ORAL_TABLET | ORAL | Status: AC
  Administered 2011-04-20: 5 mg via ORAL
  Filled 2011-04-19: qty 1

## 2011-04-19 MED ORDER — CLOPIDOGREL BISULFATE 75 MG PO TABS
75.0000 mg | ORAL_TABLET | Freq: Every day | ORAL | Status: DC
  Administered 2011-04-19 – 2011-04-23 (×5): 75 mg via ORAL
  Filled 2011-04-19 (×5): qty 1

## 2011-04-19 MED ORDER — ACETAMINOPHEN 325 MG PO TABS
650.0000 mg | ORAL_TABLET | ORAL | Status: DC | PRN
  Administered 2011-04-20: 650 mg via ORAL
  Filled 2011-04-19: qty 2

## 2011-04-19 MED ORDER — SODIUM CHLORIDE 0.9 % IJ SOLN
3.0000 mL | Freq: Two times a day (BID) | INTRAMUSCULAR | Status: DC
  Administered 2011-04-20: 3 mL via INTRAVENOUS

## 2011-04-19 MED ORDER — HEPARIN SOD (PORCINE) IN D5W 100 UNIT/ML IV SOLN
12.0000 [IU]/kg/h | INTRAVENOUS | Status: DC
  Administered 2011-04-19 (×2): 12 [IU]/kg/h via INTRAVENOUS
  Filled 2011-04-19: qty 250

## 2011-04-19 MED ORDER — HYDROCHLOROTHIAZIDE 12.5 MG PO CAPS
12.5000 mg | ORAL_CAPSULE | Freq: Every day | ORAL | Status: DC
  Administered 2011-04-19 – 2011-04-20 (×2): 12.5 mg via ORAL
  Filled 2011-04-19 (×3): qty 1

## 2011-04-19 MED ORDER — MORPHINE SULFATE 2 MG/ML IJ SOLN
2.0000 mg | INTRAMUSCULAR | Status: DC | PRN
  Administered 2011-04-20 (×3): 2 mg via INTRAVENOUS
  Filled 2011-04-19 (×2): qty 1

## 2011-04-19 MED ORDER — AMLODIPINE BESYLATE 5 MG PO TABS
5.0000 mg | ORAL_TABLET | Freq: Every day | ORAL | Status: DC
  Administered 2011-04-19 – 2011-04-20 (×2): 5 mg via ORAL
  Filled 2011-04-19 (×2): qty 1

## 2011-04-19 NOTE — ED Provider Notes (Signed)
History     CSN: [id number] Arrival date & time: 04/19/2011 10:55 AM   First MD Initiated Contact with Patient 04/19/11 1206      Chief Complaint  Patient presents with  . Chest Pain   HPI She presents to emergency room with complaint of substernal chest pain that radiates into his left shoulder. Patient reports that it is worse with movement. Patient reports coughing. Patient reports that he is seen by Dr. Excell Seltzer at [facility] cardiology. She had 5 stents placed in June of 2010. Patient reports that he has not had a stress test since this time. Patient denies any other complaints. Reports associated SOB. Denies that the pain is inspiration on nature. Patient reports some exertional chest pain. Past Medical History  Diagnosis Date  . Coronary artery disease     s/p NSTEMI 10/2007, RCA and LCx PCI with drug-eluting stents  . Hypertension     Poorly controlled  . DJD (degenerative joint disease)   . Hypercholesterolemia   . Myocardial infarction   . Emphysema     Past Surgical History  Procedure Date  . Cervical disc surgery   . Coronary angioplasty with stent placement     Family History  Problem Relation Age of Onset  . Heart attack Mother 64  . Pneumonia Father     History  Substance Use Topics  . Smoking status: Former Smoker    Quit date: 05/17/2005  . Smokeless tobacco: Not on file  . Alcohol Use: No      Review of Systems  Constitutional: Negative for fever, chills, diaphoresis and appetite change.  HENT: Negative for neck pain.   Eyes: Negative for photophobia and visual disturbance.  Respiratory: Positive for cough and shortness of breath. Negative for chest tightness, wheezing and stridor.   Cardiovascular: Positive for chest pain. Negative for palpitations and leg swelling.  Gastrointestinal: Negative for nausea, vomiting and abdominal pain.  Genitourinary: Negative for flank pain.  Musculoskeletal: Negative for back pain.  Skin: Negative for rash.    Neurological: Negative for weakness and numbness.  All other systems reviewed and are negative.    Allergies  Review of patient's allergies indicates no known allergies.  Home Medications   Current Outpatient Rx  Name Route Sig Dispense Refill  . AMLODIPINE BESYLATE 5 MG PO TABS Oral Take 5 mg by mouth daily.      . ASPIRIN 81 MG PO TABS Oral Take 81 mg by mouth daily.      . ATORVASTATIN CALCIUM 40 MG PO TABS Oral Take 40 mg by mouth daily.      Marland Kitchen CLOPIDOGREL BISULFATE 75 MG PO TABS Oral Take 75 mg by mouth daily.      Marland Kitchen HYDROCHLOROTHIAZIDE 12.5 MG PO CAPS Oral Take 12.5 mg by mouth daily.      Marland Kitchen LISINOPRIL 40 MG PO TABS Oral Take 40 mg by mouth daily.      Marland Kitchen NITROGLYCERIN 0.4 MG SL SUBL Sublingual Place 0.4 mg under the tongue every 5 (five) minutes x 3 doses as needed. For shortness of breath    . TADALAFIL 10 MG PO TABS Oral Take 10 mg by mouth daily as needed. For erectile dysfunction       BP 208/94  Pulse 79  Temp(Src) 99.2 F (37.3 C) (Oral)  Resp 18  Ht 6\' 1"  (1.854 m)  Wt 180 lb (81.647 kg)  BMI 23.75 kg/m2  SpO2 99%  Physical Exam  Nursing note and vitals reviewed. Constitutional: He is  oriented to person, place, and time. He appears well-developed and well-nourished.  HENT:  Head: Normocephalic and atraumatic.  Eyes: EOM are normal. Pupils are equal, round, and reactive to light.  Neck: Normal range of motion. Neck supple.  Cardiovascular: Normal rate and regular rhythm.   Pulmonary/Chest: Effort normal and breath sounds normal. No respiratory distress. He has no wheezes. He has no rales. He exhibits no tenderness.  Abdominal: Soft. Bowel sounds are normal. He exhibits no distension. There is no tenderness. There is no rebound.  Genitourinary: Penis normal.  Neurological: He is alert and oriented to person, place, and time.  Skin: Skin is warm and dry. No rash noted. No erythema. No pallor.  Psychiatric: He has a normal mood and affect. His behavior is  normal. Judgment and thought content normal.    ED Course  Procedures (including critical care time)  Patient seen and evaluated.  VSS reviewed. . Nursing notes reviewed. Initial testing ordered. Will monitor the patient closely. They agree with the treatment plan and diagnosis.   Results for orders placed during the hospital encounter of 04/19/11  BASIC METABOLIC PANEL      Component Value Range   Sodium 140  135 - 145 (mEq/L)   Potassium 3.6  3.5 - 5.1 (mEq/L)   Chloride 102  96 - 112 (mEq/L)   CO2 27  19 - 32 (mEq/L)   Glucose, Bld 110 (*) 70 - 99 (mg/dL)   BUN 7  6 - 23 (mg/dL)   Creatinine, Ser 1.61  0.50 - 1.35 (mg/dL)   Calcium 8.8  8.4 - 09.6 (mg/dL)   GFR calc non Af Amer >90  >90 (mL/min)   GFR calc Af Amer >90  >90 (mL/min)  CBC      Component Value Range   WBC 9.0  4.0 - 10.5 (K/uL)   RBC 4.54  4.22 - 5.81 (MIL/uL)   Hemoglobin 12.4 (*) 13.0 - 17.0 (g/dL)   HCT 04.5 (*) 40.9 - 52.0 (%)   MCV 81.9  78.0 - 100.0 (fL)   MCH 27.3  26.0 - 34.0 (pg)   MCHC 33.3  30.0 - 36.0 (g/dL)   RDW 81.1 (*) 91.4 - 15.5 (%)   Platelets 259  150 - 400 (K/uL)  DIFFERENTIAL      Component Value Range   Neutrophils Relative 64  43 - 77 (%)   Neutro Abs 5.8  1.7 - 7.7 (K/uL)   Lymphocytes Relative 20  12 - 46 (%)   Lymphs Abs 1.8  0.7 - 4.0 (K/uL)   Monocytes Relative 16 (*) 3 - 12 (%)   Monocytes Absolute 1.4 (*) 0.1 - 1.0 (K/uL)   Eosinophils Relative 0  0 - 5 (%)   Eosinophils Absolute 0.0  0.0 - 0.7 (K/uL)   Basophils Relative 0  0 - 1 (%)   Basophils Absolute 0.0  0.0 - 0.1 (K/uL)  PRO B NATRIURETIC PEPTIDE      Component Value Range   BNP, POC 609.0 (*) 0 - 125 (pg/mL)  POCT I-STAT TROPONIN I      Component Value Range   Troponin i, poc 0.00  0.00 - 0.08 (ng/mL)   Comment 3            Dg Chest 2 View  04/19/2011  *RADIOLOGY REPORT*  Clinical Data: Cough.  Rule out pneumonia  CHEST - 2 VIEW  Comparison: 08/02/2009  Findings: The lungs are hyperinflated and there are  coarsened interstitial markings of COPD.  Advanced bullous emphysema is noted which appears most evident in the right lung apex as before appear  Right midlung scarring is again noted.  The left lung is clear.  No pleural effusions or pulmonary edema.  Heart size is normal.  IMPRESSION:  1.  No evidence for pneumonia. 2.  Chronic pulmonary parenchymal changes of emphysema.  Original Report Authenticated By: Rosealee Albee, M.D.   3:21 PM [facility] to come see the patient.   Date: 04/19/2011,1059  Rate: 77  Rhythm: normal sinus rhythm, premature atrial contractions (PAC) and premature ventricular contractions (PVC)  QRS Axis: normal  Intervals: normal  ST/T Wave abnormalities: nonspecific ST/T changes  Conduction Disutrbances:none  Narrative Interpretation:   Old EKG Reviewed: no significant changes    MDM  Chest pain    Demetrius Charity, PA 04/19/11 1524

## 2011-04-19 NOTE — Progress Notes (Signed)
ANTICOAGULATION CONSULT NOTE - Initial Consult  Pharmacy Consult for heparin Indication: chest pain/ACS  No Known Allergies  Patient Measurements: Height: 6\' 1"  (185.4 cm) Weight: 180 lb (81.647 kg) IBW/kg (Calculated) : 79.9    Vital Signs: Temp: 98.8 F (37.1 C) (12/03 1848) Temp src: Oral (12/03 1848) BP: 166/81 mmHg (12/03 1848) Pulse Rate: 83  (12/03 1848)  Labs:  Basename 04/19/11 1523 04/19/11 1322  HGB -- 12.4*  HCT -- 37.2*  PLT -- 259  APTT -- --  LABPROT -- --  INR -- --  HEPARINUNFRC -- --  CREATININE -- 0.80  CKTOTAL -- --  CKMB -- --  TROPONINI <0.30 --   Estimated Creatinine Clearance: 112.4 ml/min (by C-G formula based on Cr of 0.8).  Medical History: Past Medical History  Diagnosis Date  . Coronary artery disease     s/p NSTEMI 10/2007, RCA and LCx PCI with drug-eluting stents  . Hypertension     Poorly controlled  . DJD (degenerative joint disease)   . Hypercholesterolemia   . Myocardial infarction   . Emphysema   . Angina   . COPD (chronic obstructive pulmonary disease)   . Shortness of breath   . Pneumonia     Medications:  Prescriptions prior to admission  Medication Sig Dispense Refill  . amLODipine (NORVASC) 5 MG tablet Take 5 mg by mouth daily.        Marland Kitchen aspirin 81 MG tablet Take 81 mg by mouth daily.        Marland Kitchen atorvastatin (LIPITOR) 40 MG tablet Take 40 mg by mouth daily.        . clopidogrel (PLAVIX) 75 MG tablet Take 75 mg by mouth daily.        . hydrochlorothiazide (MICROZIDE) 12.5 MG capsule Take 12.5 mg by mouth daily.        Marland Kitchen lisinopril (PRINIVIL,ZESTRIL) 40 MG tablet Take 40 mg by mouth daily.        . nitroGLYCERIN (NITROSTAT) 0.4 MG SL tablet Place 0.4 mg under the tongue every 5 (five) minutes x 3 doses as needed. For shortness of breath      . tadalafil (CIALIS) 10 MG tablet Take 10 mg by mouth daily as needed. For erectile dysfunction         Assessment: 60 yo man with significant PMH with chest pain, r/o  MI. Goal of Therapy:  Heparin level 0.3-0.7 units/ml   Plan:  Heparin 4000 unit bolus, followed by heparin drip at 1000 units/hr. Check 6 hr HL and daily HL and CBC while on heparin.  Len Childs T 04/19/2011,7:14 PM

## 2011-04-19 NOTE — ED Notes (Signed)
Pt has hx of mi with stents placed in June of 2010. He states that last night he started to develop substernal/left sided chest pain that is sharp in nature. Alert and oriented. Currently denies any other s/s but states that last night he did feel short of breath at times.

## 2011-04-19 NOTE — Consult Note (Signed)
Cardiology Consult Note   Patient ID: [name] MRN: [id number], DOB/AGE: March 03, 1951   Admit date: 04/19/2011 Date of Consult: 04/19/2011  Primary Physician: No primary provider on file. Primary Cardiologist: Tonny Bollman, MD   Pt. Profile: [name] is a pleasant 60 yo African American male with PMHx significant for CAD (NSTEMI 10/2007 s/p RCA multiple overlapping DES and LCx staged PCI), HTN (poorly controlled), HL, hx of tobacco abuse (30+ pack-year history) and premature FHx of CAD presenting to [facility] ED with worsening chest pain.   Problem List: Past Medical History  Diagnosis Date  . Coronary artery disease     s/p NSTEMI 10/2007, RCA and LCx PCI with drug-eluting stents  . Hypertension     Poorly controlled  . DJD (degenerative joint disease)   . Hypercholesterolemia   . Myocardial infarction   . Emphysema   . Angina   . COPD (chronic obstructive pulmonary disease)   . Shortness of breath   . Pneumonia     Past Surgical History  Procedure Date  . Cervical disc surgery   . Coronary angioplasty with stent placement      Allergies: No Known Allergies  HPI:   Patient reports a three-day history of worsening chest pain originating substernally and radiating to the left shoulder and left arm. He describes the pain as constant and sharp rated at a 9/10 and reportedly similar to chest pain experienced with his previous MI worse on inspiration. He reports associated shortness of breath, diaphoresis, hot flashes and chills. He denies palpitations, nausea vomiting, dizziness, cough, DOE, orthopnea, PND, worsening edema, sick contacts, or extended travel.  In the ED POC troponin I was negative. Chest x-ray revealed hyperinflation coarsened interstitial markings, advanced bullous changes consistent with emphysema and right midlung scarring, no pleural effusions, pulmonary edema or cardiomegaly were noted. EKG revealed normal sinus rhythm, 80 bpm, with trace PVCs and  blunted/TWIs in V1, V2 which was new from a previous EKG dated 08/19/10. He was given full dose aspirin and 2 injections of morphine which relieved his pain. He is currently stable, resting comfortably.   Inpatient Medications:     . aspirin  324 mg Oral Once  . influenza  inactive virus vaccine  0.5 mL Intramuscular Tomorrow-1000  . morphine  4 mg Intravenous Once  . morphine  4 mg Intravenous Once  . pneumococcal 23 valent vaccine  0.5 mL Intramuscular Tomorrow-1000   Outpatient Medications: Amlodipine 5mg  daily Aspirin 81mg  daily Plavix 75mg  daily Crestor 20mg  daily HCTZ 25mg  daily Lisinopril 40mg  daiy NTG 0.4mg  SL PRN Cialis 10mg  daily  Family History  Problem Relation Age of Onset  . Heart attack Mother 46  . Pneumonia Father      History   Social History  . Marital Status: Married    Spouse Name: N/A    Number of Children: N/A  . Years of Education: N/A   Occupational History  . Not on file.   Social History Main Topics  . Smoking status: Former Smoker    Quit date: 05/17/2005  . Smokeless tobacco: Not on file  . Alcohol Use: No  . Drug Use: No  . Sexually Active: Yes   Other Topics Concern  . Not on file   Social History Narrative   Disabled veteran   Previous smoker - quit 2007   Previous Etoh and marijuana, quit 2009    Review of Systems: General: positive for night sweats, chills Cardiovascular: positive for chest pain, shortness of breath,  negative for dyspnea on exertion, edema, orthopnea, palpitations, paroxysmal nocturnal dyspnea  Dermatological: negative for rash Respiratory: positive for chronic cough, negative for wheezing Urologic: negative for hematuria Abdominal: negative for nausea, vomiting, diarrhea, bright red blood per rectum, melena, or hematemesis Neurologic: negative for visual changes, syncope, or dizziness All other systems reviewed and are otherwise negative except as noted above.  Physical Exam: Blood pressure 208/94, pulse  80, temperature 100.2 F (37.9 C), temperature source Oral, resp. rate 16, height 6\' 1"  (1.854 m), weight 81.647 kg (180 lb), SpO2 97.00%.    General: Well developed, well nourished, NAD Head: Normocephalic, atraumatic, sclera non-icteric  Neck: Negative for carotid bruits. JVD not elevated. Lungs: Clear bilaterally to auscultation without wheezes, rales, or rhonchi. Breathing is unlabored. Heart: RRR with S1 S2. No murmurs, rubs, or gallops appreciated. Abdomen: Soft, non-tender, non-distended with normoactive bowel sounds. No hepatomegaly. No rebound/guarding. No obvious abdominal masses. Msk:  Strength and tone appears normal for age. Extremities: Dry, no clubbing, cyanosis or edema.  Distal pedal pulses are 2+ and equal bilaterally. Femoral bruits. Neuro: Alert and oriented X 3. Moves all extremities spontaneously. Psych:  Responds to questions appropriately with a normal affect.  Labs: Recent Labs  Basename 04/19/11 1322   WBC 9.0   HGB 12.4*   HCT 37.2*   MCV 81.9   PLT 259   No results found for this basename: VITAMINB12,FOLATE,FERRITIN,TIBC,IRON,RETICCTPCT in the last 72 hours No results found for this basename: DDIMER:2 in the last 72 hours  Lab 04/19/11 1322  NA 140  K 3.6  CL 102  CO2 27  BUN 7  CREATININE 0.80  CALCIUM 8.8  PROT --  BILITOT --  ALKPHOS --  ALT --  AST --  AMYLASE --  LIPASE --  GLUCOSE 110*   No results found for this basename: HGBA1C in the last 72 hours Recent Labs  Basename 04/19/11 1523   CKTOTAL --   CKMB --   CKMBINDEX --   TROPONINI <0.30   Recent Labs  Basename 04/19/11 1318   POCBNP 609.0*   No results found for this basename: CHOL,HDL,LDLCALC,TRIG,CHOLHDL,LDLDIRECT in the last 72 hours No results found for this basename: TSH,T4TOTAL,FREET3,T3FREE,THYROIDAB in the last 72 hours  Radiology/Studies: Dg Chest 2 View  04/19/2011  *RADIOLOGY REPORT*  Clinical Data: Cough.  Rule out pneumonia  CHEST - 2 VIEW  Comparison:  08/02/2009  Findings: The lungs are hyperinflated and there are coarsened interstitial markings of COPD.  Advanced bullous emphysema is noted which appears most evident in the right lung apex as before appear  Right midlung scarring is again noted.  The left lung is clear.  No pleural effusions or pulmonary edema.  Heart size is normal.  IMPRESSION:  1.  No evidence for pneumonia. 2.  Chronic pulmonary parenchymal changes of emphysema.  Original Report Authenticated By: Rosealee Albee, M.D.    EKG: NSR, 80 bpm, trace PAC/PVCs, new TWIs V1, V2 from previous EKG dated 08/19/2010 Tele: NSR, 70-80 bpm, trace PAC/PVCs, no acute changes  ASSESSMENT:  1) CP - concerning for [place] 2) CAD      --s/p NSTEMI 10/2007, RCA and LCx PCI with drug-eluting stents 3) HTN, uncontrolled 4) COPD 5) Heavy snoring and night sweats  PLAN/DISCUSSION;  CP with typical and atypical features but symptoms reminiscent of previous NSTEMI and thus concerning for [place]. ECG mildly abnormal. CE negative. Will need cath in am to further evaluate. Start heparin. Con't ASA, b-blocker, plavix. Will need aggressive Rx of  BP. Will also need sleep study as outpatient.    Signed, Andrew Horn, PA-C 04/19/2011, 4:39 PM   Patient seen and examined with Andrew Horn, NP. We discussed all aspects of the encounter. I agree with the assessment and plan as stated above. Sx concerning for [place]. Plan cath in am. Will need rx of HTN and also outpatient sleep study.   Reuel Boom BensimhonMD 4:49 PM

## 2011-04-19 NOTE — ED Provider Notes (Signed)
See prior note   Ward Givens, MD 04/19/11 1911

## 2011-04-19 NOTE — H&P (Signed)
Cardiology History and Physical     Patient ID: [name] MRN: [id number], DOB/AGE: 59-Jun-1952    Admit date: 04/19/2011 Date of Consult: 04/19/2011   Primary Physician: No primary provider on file. Primary Cardiologist: Tonny Bollman, MD       Pt. Profile: Mr. Kamphaus is a pleasant 60 yo African American male with PMHx significant for CAD (NSTEMI 10/2007 s/p RCA multiple overlapping DES and LCx staged PCI), HTN (poorly controlled), HL, hx of tobacco abuse (30+ pack-year history) and premature FHx of CAD presenting to [facility] ED with worsening chest pain.    Problem List: Past Medical History   Diagnosis  Date   .  Coronary artery disease         s/p NSTEMI 10/2007, RCA and LCx PCI with drug-eluting stents   .  Hypertension         Poorly controlled   .  DJD (degenerative joint disease)     .  Hypercholesterolemia     .  Myocardial infarction     .  Emphysema     .  Angina     .  COPD (chronic obstructive pulmonary disease)     .  Shortness of breath     .  Pneumonia        Past Surgical History   Procedure  Date   .  Cervical disc surgery     .  Coronary angioplasty with stent placement        Allergies: No Known Allergies   HPI:    Patient reports a three-day history of worsening chest pain originating substernally and radiating to the left shoulder and left arm. He describes the pain as constant and sharp rated at a 9/10 and reportedly similar to chest pain experienced with his previous MI worse on inspiration. He reports associated shortness of breath, diaphoresis, hot flashes and chills. He denies palpitations, nausea vomiting, dizziness, cough, DOE, orthopnea, PND, worsening edema, sick contacts, or extended travel.   In the ED POC troponin I was negative. Chest x-ray revealed hyperinflation coarsened interstitial markings, advanced bullous changes consistent with emphysema and right midlung scarring, no pleural effusions, pulmonary edema or cardiomegaly were  noted. EKG revealed normal sinus rhythm, 80 bpm, with trace PVCs and blunted/TWIs in V1, V2 which was new from a previous EKG dated 08/19/10. He was given full dose aspirin and 2 injections of morphine which relieved his pain. He is currently stable, resting comfortably.    Inpatient Medications:      .  aspirin   324 mg  Oral  Once   .  influenza  inactive virus vaccine   0.5 mL  Intramuscular  Tomorrow-1000   .  morphine   4 mg  Intravenous  Once   .  morphine   4 mg  Intravenous  Once   .  pneumococcal 23 valent vaccine   0.5 mL  Intramuscular  Tomorrow-1000    Outpatient Medications: Amlodipine 5mg  daily Aspirin 81mg  daily Plavix 75mg  daily Crestor 20mg  daily HCTZ 25mg  daily Lisinopril 40mg  daiy NTG 0.4mg  SL PRN Cialis 10mg  daily    Family History   Problem  Relation  Age of Onset   .  Heart attack  Mother  60   .  Pneumonia  Father         History       Social History   .  Marital Status:  Married       Spouse Name:  N/A  Number of Children:  N/A   .  Years of Education:  N/A       Occupational History   .  Not on file.       Social History Main Topics   .  Smoking status:  Former Smoker       Quit date:  05/17/2005   .  Smokeless tobacco:  Not on file   .  Alcohol Use:  No   .  Drug Use:  No   .  Sexually Active:  Yes       Other Topics  Concern   .  Not on file       Social History Narrative     Disabled veteran   Previous smoker - quit 2007   Previous Etoh and marijuana, quit 2009      Review of Systems: General: positive for night sweats, chills Cardiovascular: positive for chest pain, shortness of breath, negative for dyspnea on exertion, edema, orthopnea, palpitations, paroxysmal nocturnal dyspnea   Dermatological: negative for rash Respiratory: positive for chronic cough, negative for wheezing Urologic: negative for hematuria Abdominal: negative for nausea, vomiting, diarrhea, bright red blood per rectum, melena, or  hematemesis Neurologic: negative for visual changes, syncope, or dizziness All other systems reviewed and are otherwise negative except as noted above.   Physical Exam: Blood pressure 208/94, pulse 80, temperature 100.2 F (37.9 C), temperature source Oral, resp. rate 16, height 6\' 1"  (1.854 m), weight 81.647 kg (180 lb), SpO2 97.00%.     General: Well developed, well nourished, NAD Head: Normocephalic, atraumatic, sclera non-icteric            Neck: Negative for carotid bruits. JVD not elevated. Lungs: Clear bilaterally to auscultation without wheezes, rales, or rhonchi. Breathing is unlabored. Heart: RRR with S1 S2. No murmurs, rubs, or gallops appreciated. Abdomen: Soft, non-tender, non-distended with normoactive bowel sounds. No hepatomegaly. No rebound/guarding. No obvious abdominal masses. Msk:  Strength and tone appears normal for age. Extremities: Dry, no clubbing, cyanosis or edema.  Distal pedal pulses are 2+ and equal bilaterally. Femoral bruits. Neuro: Alert and oriented X 3. Moves all extremities spontaneously. Psych:  Responds to questions appropriately with a normal affect.   Labs: Recent Labs   Basename  04/19/11 1322     WBC  9.0     HGB  12.4*     HCT  37.2*     MCV  81.9     PLT  259    No results found for this basename: VITAMINB12,FOLATE,FERRITIN,TIBC,IRON,RETICCTPCT in the last 72 hours No results found for this basename: DDIMER:2 in the last 72 hours Lab  04/19/11 1322   NA  140   K  3.6   CL  102   CO2  27   BUN  7   CREATININE  0.80   CALCIUM  8.8   PROT  --   BILITOT  --   ALKPHOS  --   ALT  --   AST  --   AMYLASE  --   LIPASE  --   GLUCOSE  110*    No results found for this basename: HGBA1C in the last 72 hours Recent Labs   Basename  04/19/11 1523     CKTOTAL  --     CKMB  --     CKMBINDEX  --     TROPONINI  <0.30    Recent Labs   Basename  04/19/11 1318     POCBNP  609.0*  No results found for this basename:  CHOL,HDL,LDLCALC,TRIG,CHOLHDL,LDLDIRECT in the last 72 hours No results found for this basename: TSH,T4TOTAL,FREET3,T3FREE,THYROIDAB in the last 72 hours   Radiology/Studies: Dg Chest 2 View   04/19/2011  *RADIOLOGY REPORT*  Clinical Data: Cough.  Rule out pneumonia  CHEST - 2 VIEW  Comparison: 08/02/2009  Findings: The lungs are hyperinflated and there are coarsened interstitial markings of COPD.  Advanced bullous emphysema is noted which appears most evident in the right lung apex as before appear  Right midlung scarring is again noted.  The left lung is clear.  No pleural effusions or pulmonary edema. Heart size is normal.  IMPRESSION:  1.  No evidence for pneumonia. 2.  Chronic pulmonary parenchymal changes of emphysema.  Original Report Authenticated By: Rosealee Albee, M.D.       EKG: NSR, 80 bpm, trace PAC/PVCs, new TWIs V1, V2 from previous EKG dated 08/19/2010 Tele: NSR, 70-80 bpm, trace PAC/PVCs, no acute changes   ASSESSMENT:   1) CP - concerning for [place] 2) CAD       --s/p NSTEMI 10/2007, RCA and LCx PCI with drug-eluting stents 3) HTN, uncontrolled 4) COPD 5) Heavy snoring and night sweats   PLAN/DISCUSSION;   CP with typical and atypical features but symptoms reminiscent of previous NSTEMI and thus concerning for [place]. ECG mildly abnormal. CE negative. Will need cath in am to further evaluate. Start heparin. Con't ASA, b-blocker, plavix. Will need aggressive Rx of BP. Will also need sleep study as outpatient.      Signed, Hurman Horn, PA-C 04/19/2011, 4:39 PM     Patient seen and examined with Hurman Horn, NP. We discussed all aspects of the encounter. I agree with the assessment and plan as stated above. Sx concerning for [place]. Plan cath in am. Will need rx of HTN and also outpatient sleep study.    Reuel Boom BensimhonMD 4:49 PM

## 2011-04-19 NOTE — ED Provider Notes (Signed)
Patient relates he has had 4 cardiac stents the last was about 2 years ago. He has had some left shoulder and some anterior chest pain that started about 3 days ago and has been off and on. He states today the pain has been constant all day today.  Patient is alert and appears to be in no distress Medical screening examination/treatment/procedure(s) were conducted as a shared visit with non-physician practitioner(s) and myself.  I personally evaluated the patient during the encounter Devoria Albe, MD, Franz Dell, MD 04/19/11 [phone]

## 2011-04-20 ENCOUNTER — Other Ambulatory Visit: Payer: Self-pay

## 2011-04-20 ENCOUNTER — Encounter (HOSPITAL_COMMUNITY)
Admission: EM | Disposition: A | Discharge status: Home / Self Care | Payer: Self-pay | Source: Ambulatory Visit | Attending: Internal Medicine

## 2011-04-20 DIAGNOSIS — I2510 Atherosclerotic heart disease of native coronary artery without angina pectoris: Secondary | ICD-10-CM

## 2011-04-20 HISTORY — PX: LEFT HEART CATHETERIZATION WITH CORONARY ANGIOGRAM: SHX5451

## 2011-04-20 LAB — CBC
HCT: 33.8 % — ABNORMAL LOW (ref 39.0–52.0)
Hemoglobin: 11 g/dL — ABNORMAL LOW (ref 13.0–17.0)
MCHC: 32.5 g/dL (ref 30.0–36.0)
MCV: 81.3 fL (ref 78.0–100.0)
RDW: 15.1 % (ref 11.5–15.5)
WBC: 9.1 10*3/uL (ref 4.0–10.5)

## 2011-04-20 LAB — BASIC METABOLIC PANEL
CO2: 26 mEq/L (ref 19–32)
Calcium: 8.5 mg/dL (ref 8.4–10.5)
Chloride: 98 mEq/L (ref 96–112)
Creatinine, Ser: 0.76 mg/dL (ref 0.50–1.35)
Glucose, Bld: 128 mg/dL — ABNORMAL HIGH (ref 70–99)

## 2011-04-20 LAB — INFLUENZA PANEL BY PCR (TYPE A & B)
H1N1 flu by pcr: NOT DETECTED
Influenza B By PCR: NEGATIVE

## 2011-04-20 LAB — LIPID PANEL
HDL: 58 mg/dL (ref >39)
LDL Cholesterol: 44 mg/dL (ref 0–99)
Triglycerides: 108 mg/dL (ref <150)
VLDL: 22 mg/dL (ref 0–40)

## 2011-04-20 LAB — PROTIME-INR: Prothrombin Time: 15.7 seconds — ABNORMAL HIGH (ref 11.6–15.2)

## 2011-04-20 LAB — HEPARIN LEVEL (UNFRACTIONATED): Heparin Unfractionated: 0.32 IU/mL (ref 0.30–0.70)

## 2011-04-20 LAB — CARDIAC PANEL(CRET KIN+CKTOT+MB+TROPI)
CK, MB: 1.1 ng/mL (ref 0.3–4.0)
CK, MB: 1.2 ng/mL (ref 0.3–4.0)
Total CK: 75 U/L (ref 7–232)
Total CK: 70 U/L (ref 7–232)
Troponin I: <0.3 ng/mL (ref <0.30)

## 2011-04-20 SURGERY — LEFT HEART CATHETERIZATION WITH CORONARY ANGIOGRAM
Anesthesia: LOCAL

## 2011-04-20 MED ORDER — FENTANYL CITRATE 0.05 MG/ML IJ SOLN
INTRAMUSCULAR | Status: AC
  Filled 2011-04-20: qty 2

## 2011-04-20 MED ORDER — AMLODIPINE BESYLATE 10 MG PO TABS
10.0000 mg | ORAL_TABLET | Freq: Every day | ORAL | Status: DC
  Administered 2011-04-21 – 2011-04-23 (×3): 10 mg via ORAL
  Filled 2011-04-20 (×3): qty 1

## 2011-04-20 MED ORDER — HEPARIN SOD (PORCINE) IN D5W 100 UNIT/ML IV SOLN
1100.0000 [IU]/h | INTRAVENOUS | Status: DC
  Administered 2011-04-20: 1100 [IU]/h via INTRAVENOUS
  Filled 2011-04-20 (×2): qty 250

## 2011-04-20 MED ORDER — MIDAZOLAM HCL 2 MG/2ML IJ SOLN
INTRAMUSCULAR | Status: AC
  Filled 2011-04-20: qty 2

## 2011-04-20 MED ORDER — CARVEDILOL 12.5 MG PO TABS
12.5000 mg | ORAL_TABLET | Freq: Two times a day (BID) | ORAL | Status: DC
  Administered 2011-04-20 – 2011-04-21 (×2): 12.5 mg via ORAL
  Filled 2011-04-20 (×4): qty 1

## 2011-04-20 MED ORDER — LIDOCAINE HCL (PF) 1 % IJ SOLN
INTRAMUSCULAR | Status: AC
  Filled 2011-04-20: qty 30

## 2011-04-20 MED ORDER — HEPARIN (PORCINE) IN NACL 2-0.9 UNIT/ML-% IJ SOLN
INTRAMUSCULAR | Status: AC
  Filled 2011-04-20: qty 2000

## 2011-04-20 MED ORDER — NITROGLYCERIN 0.2 MG/ML ON CALL CATH LAB
INTRAVENOUS | Status: AC
  Filled 2011-04-20: qty 1

## 2011-04-20 MED ORDER — MORPHINE SULFATE 4 MG/ML IJ SOLN
INTRAMUSCULAR | Status: AC
  Filled 2011-04-20: qty 1

## 2011-04-20 MED ORDER — HYDRALAZINE HCL 20 MG/ML IJ SOLN
INTRAMUSCULAR | Status: AC
  Filled 2011-04-20: qty 1

## 2011-04-20 MED ORDER — SODIUM CHLORIDE 0.9 % IV SOLN
1.0000 mL/kg/h | INTRAVENOUS | Status: AC
  Administered 2011-04-20: 1 mL/kg/h via INTRAVENOUS

## 2011-04-20 MED ORDER — ACETAMINOPHEN 325 MG PO TABS
650.0000 mg | ORAL_TABLET | ORAL | Status: DC | PRN
  Administered 2011-04-20 – 2011-04-22 (×6): 650 mg via ORAL
  Filled 2011-04-20 (×6): qty 2

## 2011-04-20 NOTE — H&P (Signed)
Patient seen and examined with Hurman Horn PA. We discussed all aspects of the encounter. I agree with the assessment and plan as stated above. Please see my notes below. Pt with known CAD now presenting with [place] in setting of marked HTN. Will admit. Control BP. R/o MI. Plan cath tomorrow.

## 2011-04-20 NOTE — Progress Notes (Signed)
ANTICOAGULATION CONSULT NOTE - Initial Consult  Pharmacy Consult for heparin Indication: chest pain/ACS  No Known Allergies  Patient Measurements: Height: 6\' 1"  (185.4 cm) Weight: 180 lb (81.647 kg) IBW/kg (Calculated) : 79.9    Vital Signs: Temp: 99.7 F (37.6 C) (12/03 2215) Temp src: Oral (12/03 2215) BP: 164/93 mmHg (12/03 2215) Pulse Rate: 86  (12/03 2215)  Labs:  Basename 04/20/11 0251 04/20/11 0238 04/19/11 1924 04/19/11 1523 04/19/11 1322  HGB -- 11.0* -- -- 12.4*  HCT -- 33.8* -- -- 37.2*  PLT -- 266 -- -- 259  APTT -- -- 30 -- --  LABPROT -- -- -- -- --  INR -- -- -- -- --  HEPARINUNFRC -- 0.32 -- -- --  CREATININE -- 0.76 -- -- 0.80  CKTOTAL 75 -- 101 -- --  CKMB 1.1 -- 1.3 -- --  TROPONINI <0.30 -- <0.30 <0.30 --   Estimated Creatinine Clearance: 112.4 ml/min (by C-G formula based on Cr of 0.76).  Assessment: 60 yo man with chest pain for Heparin.  Goal of Therapy:  Heparin level 0.3-0.7 units/ml   Plan:  Increase Heparin 1100 units/hr to keep in range, f/u after cath.  Joao Mccurdy, Gary Fleet 04/20/2011,4:25 AM

## 2011-04-20 NOTE — Interval H&P Note (Signed)
History and Physical Interval Note:  04/20/2011 10:07 AM  [name]  has presented today for surgery, with the diagnosis of Chest pain  The various methods of treatment have been discussed with the patient and family. After consideration of risks, benefits and other options for treatment, the patient has consented to  Procedure(s): LEFT HEART CATHETERIZATION WITH CORONARY ANGIOGRAM as a surgical intervention .  The patients' history has been reviewed, patient examined, no change in status, stable for surgery.  I have reviewed the patients' chart and labs.  Questions were answered to the patient's satisfaction.     Thedora Hinders

## 2011-04-20 NOTE — H&P (View-Only) (Signed)
Subjective:  Chest pain has resolved on NTG drip. He notes chest pain and dyspnea over the past 3-4 days. Symptoms feel similar to those of his NSTEMI several years ago.  Objective:  Vital Signs in the last 24 hours: Temp:  [98.7 F (37.1 C)-100.2 F (37.9 C)] 99.1 F (37.3 C) (12/04 0755) Pulse Rate:  [77-86] 77  (12/04 0444) Resp:  [16-20] 20  (12/04 0444) BP: (111-208)/(63-118) 147/84 mmHg (12/04 0444) SpO2:  [95 %-99 %] 99 % (12/04 0444) FiO2 (%):  [2 %] 2 % (12/04 0444) Weight:  [81.647 kg (180 lb)] 180 lb (81.647 kg) (12/03 1105)  Intake/Output from previous day: 12/03 0701 - 12/04 0700 In: 360 [P.O.:360] Out: 800 [Urine:800]  Physical Exam: Pt is alert and oriented, NAD HEENT: normal Neck: JVP - normal, carotids 2+= without bruits Lungs: CTA bilaterally CV: RRR without murmur or gallop Abd: soft, NT, Positive BS, no hepatomegaly Ext: no C/C/E, distal pulses intact and equal Skin: warm/dry no rash  Lab Results:  Basename 04/20/11 0238 04/19/11 1322  WBC 9.1 9.0  HGB 11.0* 12.4*  PLT 266 259    Basename 04/20/11 0238 04/19/11 1322  NA 136 140  K 3.4* 3.6  CL 98 102  CO2 26 27  GLUCOSE 128* 110*  BUN 6 7  CREATININE 0.76 0.80    Basename 04/20/11 0251 04/19/11 1924  TROPONINI <0.30 <0.30   Assessment/Plan:  Unstable angina - for cath and possible PCI today. Risks, indication, and alternatives reviewed with the patient and he understands, agrees to proceed.   HTN - malignant. Increase carvedilol to 12. 5 mg bid.  Lipids - well-controlled on statin Rx.   Dispo - pending cath results. Suspect he will require PCI.  Ozzy Bohlken, M.D. 04/20/2011, 8:50 AM     

## 2011-04-20 NOTE — Progress Notes (Signed)
Subjective:  Chest pain has resolved on NTG drip. He notes chest pain and dyspnea over the past 3-4 days. Symptoms feel similar to those of his NSTEMI several years ago.  Objective:  Vital Signs in the last 24 hours: Temp:  [98.7 F (37.1 C)-100.2 F (37.9 C)] 99.1 F (37.3 C) (12/04 0755) Pulse Rate:  [77-86] 77  (12/04 0444) Resp:  [16-20] 20  (12/04 0444) BP: (111-208)/(63-118) 147/84 mmHg (12/04 0444) SpO2:  [95 %-99 %] 99 % (12/04 0444) FiO2 (%):  [2 %] 2 % (12/04 0444) Weight:  [81.647 kg (180 lb)] 180 lb (81.647 kg) (12/03 1105)  Intake/Output from previous day: 12/03 0701 - 12/04 0700 In: 360 [P.O.:360] Out: 800 [Urine:800]  Physical Exam: Pt is alert and oriented, NAD HEENT: normal Neck: JVP - normal, carotids 2+= without bruits Lungs: CTA bilaterally CV: RRR without murmur or gallop Abd: soft, NT, Positive BS, no hepatomegaly Ext: no C/C/E, distal pulses intact and equal Skin: warm/dry no rash  Lab Results:  Basename 04/20/11 0238 04/19/11 1322  WBC 9.1 9.0  HGB 11.0* 12.4*  PLT 266 259    Basename 04/20/11 0238 04/19/11 1322  NA 136 140  K 3.4* 3.6  CL 98 102  CO2 26 27  GLUCOSE 128* 110*  BUN 6 7  CREATININE 0.76 0.80    Basename 04/20/11 0251 04/19/11 1924  TROPONINI <0.30 <0.30   Assessment/Plan:  Unstable angina - for cath and possible PCI today. Risks, indication, and alternatives reviewed with the patient and he understands, agrees to proceed.   HTN - malignant. Increase carvedilol to 12. 5 mg bid.  Lipids - well-controlled on statin Rx.   Dispo - pending cath results. Suspect he will require PCI.  Tonny Bollman, M.D. 04/20/2011, 8:50 AM

## 2011-04-20 NOTE — Op Note (Signed)
Cardiac Catheterization Procedure Note  Name: [name] MRN: [id number] DOB: 1951/03/11  Procedure: Left Heart Cath, Selective Coronary Angiography, LV angiography  Indication: 60 year old black male with history of coronary disease status post stenting of the right coronary and the proximal left circumflex coronary in 2009. He presents with symptoms of chest pain. He has ruled out for myocardial infarction. He is severely hypertensive.   Procedural Details: The right wrist was prepped, draped, and anesthetized with 1% lidocaine. Using the modified Seldinger technique, a 5 French sheath was introduced into the right radial artery. 3 mg of verapamil was administered through the sheath, weight-based unfractionated heparin was administered intravenously. Standard Judkins catheters were used for selective coronary angiography and left ventriculography. Catheter exchanges were performed over an exchange length guidewire. There were no immediate procedural complications. A TR band was used for radial hemostasis at the completion of the procedure.  The patient was transferred to the post catheterization recovery area for further monitoring.  Procedural Findings: Hemodynamics: AO 168/86 with a mean of 118 mmHg LV 174 with an EDP of 17 mm mercury  Coronary angiography: Coronary dominance: right  Left mainstem: Normal  Left anterior descending (LAD): This is a large vessel which extends around the apex. In the mid vessel there is 40% narrowing.  There is a moderate-sized ramus intermediate branch which has 50-60% disease.  Left circumflex (LCx): There is a focal ostial narrowing of 50-60%. The stented segment in the proximal vessel is widely patent. The first obtuse marginal vessel has a segmental 70% stenosis.  Right coronary artery (RCA): This is a large dominant vessel. The stents are widely patent. There is 20-30% disease proximal to the stent and in the distal vessel.  Left  ventriculography: Left ventricular systolic function is normal, LVEF is estimated at 55-65%, there is no significant mitral regurgitation   Final Conclusions:   1. Continued excellent patency of the stents in the right coronary and the proximal left circumflex coronary. Patient does have moderate stenosis in the first obtuse marginal vessel. There is also some focal narrowing at the ostium of the circumflex. The disease in the marginal branch and the ramus intermediate branch did not appear to be significantly changed from 2009. 2. Normal left ventricular function.  Recommendations: I recommend aggressive blood pressure control and risk factor modification. If he continues to have chest pain a functional study would be helpful.  [name] 04/20/2011, 11:05 AM

## 2011-04-21 DIAGNOSIS — M25579 Pain in unspecified ankle and joints of unspecified foot: Secondary | ICD-10-CM

## 2011-04-21 DIAGNOSIS — R079 Chest pain, unspecified: Secondary | ICD-10-CM

## 2011-04-21 DIAGNOSIS — I2510 Atherosclerotic heart disease of native coronary artery without angina pectoris: Secondary | ICD-10-CM

## 2011-04-21 DIAGNOSIS — E876 Hypokalemia: Secondary | ICD-10-CM

## 2011-04-21 LAB — CBC
HCT: 34.2 % — ABNORMAL LOW (ref 39.0–52.0)
Hemoglobin: 11.3 g/dL — ABNORMAL LOW (ref 13.0–17.0)
MCHC: 33 g/dL (ref 30.0–36.0)
RBC: 4.2 MIL/uL — ABNORMAL LOW (ref 4.22–5.81)
WBC: 10.7 10*3/uL — ABNORMAL HIGH (ref 4.0–10.5)

## 2011-04-21 LAB — BASIC METABOLIC PANEL
BUN: 8 mg/dL (ref 6–23)
CO2: 28 mEq/L (ref 19–32)
Chloride: 96 mEq/L (ref 96–112)
Creatinine, Ser: 0.81 mg/dL (ref 0.50–1.35)
GFR calc Af Amer: >90 mL/min (ref >90)

## 2011-04-21 MED ORDER — CARVEDILOL 12.5 MG PO TABS
12.5000 mg | ORAL_TABLET | ORAL | Status: AC
  Administered 2011-04-21: 12.5 mg via ORAL
  Filled 2011-04-21: qty 1

## 2011-04-21 MED ORDER — CARVEDILOL 25 MG PO TABS
25.0000 mg | ORAL_TABLET | Freq: Two times a day (BID) | ORAL | Status: DC
  Administered 2011-04-21 – 2011-04-23 (×4): 25 mg via ORAL
  Filled 2011-04-21 (×6): qty 1

## 2011-04-21 MED ORDER — COLCHICINE 0.6 MG PO TABS
0.6000 mg | ORAL_TABLET | Freq: Two times a day (BID) | ORAL | Status: DC
  Administered 2011-04-21 – 2011-04-22 (×3): 0.6 mg via ORAL
  Filled 2011-04-21 (×5): qty 1

## 2011-04-21 MED ORDER — POTASSIUM CHLORIDE CRYS ER 20 MEQ PO TBCR
20.0000 meq | EXTENDED_RELEASE_TABLET | Freq: Two times a day (BID) | ORAL | Status: DC
  Administered 2011-04-21 – 2011-04-23 (×5): 20 meq via ORAL
  Filled 2011-04-21 (×6): qty 1

## 2011-04-21 NOTE — Progress Notes (Addendum)
Patient Name: [name] Date of Encounter: 04/21/2011     Active Problems:  HYPERLIPIDEMIA-MIXED  HYPERTENSION, BENIGN  CAD, NATIVE VESSEL  Chest pain    SUBJECTIVE: Pt continues to have mild chest pain, however improving, denies SOB, diaphoresis, palpitations, n/v, cough, dysuria or chills. Notes pain in R ankle and L big toe, denies falling or trauma to the area. Pt states he occasionally experiences at home with NSAID relief. Ambulating this morning with cardiac rehab.    OBJECTIVE  Filed Vitals:   04/20/11 1500 04/20/11 2049 04/21/11 0219 04/21/11 0626  BP:  173/83 116/69 116/69  Pulse:  101 94 82  Temp: 100.4 F (38 C) 101.5 F (38.6 C) 102.8 F (39.3 C) 99 F (37.2 C)  TempSrc: Oral Oral Oral Oral  Resp:  20 20 18   Height:      Weight:      SpO2:  91% 94% 92%    Intake/Output Summary (Last 24 hours) at 04/21/11 0841 Last data filed at 04/21/11 0221  Gross per 24 hour  Intake 1563.44 ml  Output   1275 ml  Net 288.44 ml   Weight change:   PHYSICAL EXAM  General: Well developed, well nourished, NAD Head: Normocephalic, atraumatic, sclera non-icteric Neck: Supple without bruits or JVD. Lungs:  Resp regular and unlabored, CTAB, no wheezes, rales or rhonchi Heart: RRR no s3, s4, or murmurs, heaves, thrills, rubs or gallops Abdomen: Soft, non-tender, non-distended, BS + x 4.  Msk:  Strength and tone appears normal for age. R ankle with trace nonpitting edema, left 1st digit mildly swollen with calor and tender to palp Extremities: No clubbing, cyanosis or edema. DP/PT/Radials 2+ and equal bilaterally. Neuro: Alert and oriented X 3. Moves all extremities spontaneously. Psych: Normal affect.  LABS:  Recent Labs  Basename 04/21/11 0533 04/20/11 0238   WBC 10.7* 9.1   HGB 11.3* 11.0*   HCT 34.2* 33.8*   MCV 81.4 81.3   PLT 259 266   No results found for this basename: VITAMINB12,FOLATE,FERRITIN,TIBC,IRON,RETICCTPCT in the last 72 hours No results  found for this basename: DDIMER:2 in the last 72 hours  Lab 04/21/11 0533 04/20/11 0238 04/19/11 1322  NA 137 136 140  K 3.3* 3.4* 3.6  CL 96 98 102  CO2 28 26 27   BUN 8 6 7   CREATININE 0.81 0.76 0.80  CALCIUM 8.8 8.5 8.8  PROT -- -- --  BILITOT -- -- --  ALKPHOS -- -- --  ALT -- -- --  AST -- -- --  AMYLASE -- -- --  LIPASE -- -- --  GLUCOSE 122* 128* 110*   No results found for this basename: HGBA1C in the last 72 hours Recent Labs  Basename 04/20/11 0859 04/20/11 0251 04/19/11 1924   CKTOTAL 70 75 101   CKMB 1.2 1.1 1.3   CKMBINDEX -- -- --   TROPONINI <0.30 <0.30 <0.30   Recent Labs  Basename 04/19/11 1318   POCBNP 609.0*   Recent Labs  Basename 04/20/11 0238   CHOL 124   HDL 58   LDLCALC 44   TRIG 108   CHOLHDL 2.1   LDLDIRECT --   No results found for this basename: TSH,T4TOTAL,FREET3,T3FREE,THYROIDAB in the last 72 hours   TELE: NSR, 80-90 bpm, no acute ST-T wave changes  ECG: 12/4: NSR, 77 bpm, LAE, small Q waves in III, aVF, no acute changes from previous EKG on 12/3   Radiology/Studies:  Dg Chest 2 View  04/19/2011  *RADIOLOGY  REPORT*  Clinical Data: Cough.  Rule out pneumonia  CHEST - 2 VIEW  Comparison: 08/02/2009  Findings: The lungs are hyperinflated and there are coarsened interstitial markings of COPD.  Advanced bullous emphysema is noted which appears most evident in the right lung apex as before appear  Right midlung scarring is again noted.  The left lung is clear.  No pleural effusions or pulmonary edema.  Heart size is normal.  IMPRESSION:  1.  No evidence for pneumonia. 2.  Chronic pulmonary parenchymal changes of emphysema.  Original Report Authenticated By: Rosealee Albee, M.D.    Current Medications:     . amLODipine  10 mg Oral Daily  . aspirin EC  81 mg Oral Daily  . carvedilol  12.5 mg Oral BID WC  . clopidogrel  75 mg Oral Daily  . diazepam  5 mg Oral On Call  . fentaNYL      . heparin      . hydrALAZINE      .  hydrochlorothiazide  12.5 mg Oral Daily  . influenza  inactive virus vaccine  0.5 mL Intramuscular Tomorrow-1000  . lidocaine      . lisinopril  40 mg Oral Daily  . midazolam      . nitroGLYCERIN      . pneumococcal 23 valent vaccine  0.5 mL Intramuscular Tomorrow-1000  . rosuvastatin  20 mg Oral q1800  . DISCONTD: amLODipine  5 mg Oral Daily  . DISCONTD: carvedilol  6.25 mg Oral BID WC  . DISCONTD: diazepam  5 mg Oral On Call  . DISCONTD: sodium chloride  3 mL Intravenous Q12H    ASSESSMENT AND PLAN:  1. Chest pain- pt with clean cath. Reports improved chest pain. Undergoing cardiac rehab today. Possible discharge tomorrow if BP stable. Will assess need for stress study if continues to have chest pain today-->tomorrow.  2. HTN- poorly controlled on admission, increased Coreg, d/c HCTZ and NTG; will monitor, add and titrate as needed  3. R ankle, L 1st digit pain- acute last night/this morning, nontraumatic, suspected gout vs OA, will check uric acid level, d/c HCTZ, started on Colchicine. Tylenol, Ultram PRNs.  4. Hypokalemia- supplementation with KCL    Signed, Hurman Horn, PA-C 04/21/2011, 8:41 AM  I have seen and examined the patient. I agree with the above note with the addition of: The patient is still having mild chest discomfort. He is also complaining of right foot swelling and discomfort likely due to gout. I reviewed his cardiac catheterization. He does have borderline significant lesion in the ostial left circumflex but the location does not seem to be optimal for PCI. There is also borderline significant disease in the OM1. I recommend intensifying his medical therapy first and reserving PCI for refractory symptoms. Coreg will be increased to 25 mg twice daily. Regarding his gout, we'll check uric acid and discontinue hydrochlorothiazide. He will be treated with colchicine as well as pain management.    Lorine Bears 04/21/2011 3:05 PM

## 2011-04-21 NOTE — Progress Notes (Signed)
CARDIAC REHAB PHASE I   PRE:  Rate/Rhythm: 90 SR  BP:  Supine: 100/60  Sitting:   Standing:    SaO2: 95 RA  MODE:  Ambulation: to chair   POST:  Rate/Rhythem: 96  BP:  Supine:   Sitting: 120/70  Standing:    SaO2: 97 RA [id number] Attempted to ambulate pt. Only able to pivot to chair due to painful right ankle,unable to bear any weight on it. Completed angina and risk factor modification with pt. He voices understanding. Pt agrees to Outpt CRP in [place], will send referral.  Beatrix Fetters

## 2011-04-22 ENCOUNTER — Inpatient Hospital Stay (HOSPITAL_COMMUNITY): Payer: Medicare Other

## 2011-04-22 ENCOUNTER — Other Ambulatory Visit: Payer: Self-pay

## 2011-04-22 LAB — CBC
HCT: 33.4 % — ABNORMAL LOW (ref 39.0–52.0)
Hemoglobin: 10.7 g/dL — ABNORMAL LOW (ref 13.0–17.0)
MCH: 26.4 pg (ref 26.0–34.0)
MCHC: 32 g/dL (ref 30.0–36.0)
MCV: 82.3 fL (ref 78.0–100.0)

## 2011-04-22 LAB — BASIC METABOLIC PANEL
BUN: 16 mg/dL (ref 6–23)
Calcium: 9 mg/dL (ref 8.4–10.5)
Creatinine, Ser: 0.87 mg/dL (ref 0.50–1.35)
GFR calc non Af Amer: >90 mL/min (ref >90)
Glucose, Bld: 115 mg/dL — ABNORMAL HIGH (ref 70–99)

## 2011-04-22 LAB — URINALYSIS, ROUTINE W REFLEX MICROSCOPIC
Bilirubin Urine: NEGATIVE
Hgb urine dipstick: NEGATIVE
Protein, ur: NEGATIVE mg/dL
Urobilinogen, UA: >8 mg/dL — ABNORMAL HIGH (ref 0.0–1.0)

## 2011-04-22 MED ORDER — COLCHICINE 0.6 MG PO TABS
0.6000 mg | ORAL_TABLET | Freq: Four times a day (QID) | ORAL | Status: DC
  Administered 2011-04-22 – 2011-04-23 (×4): 0.6 mg via ORAL
  Filled 2011-04-22 (×8): qty 1

## 2011-04-22 NOTE — Progress Notes (Signed)
   CARE MANAGEMENT NOTE 04/22/2011  Patient:  [name]   Account Number:  [id number]  Date Initiated:  04/22/2011  Documentation initiated by:  GRAVES-BIGELOW,Kaedon Fanelli  Subjective/Objective Assessment:   Pt admitted with cp. S/p cath. Pt is from home with wife. Previously on plavix. Able to purchase medications without any problems. He is form home with wife.     Action/Plan:   CM spoke to pt about disposition for home and he refused [facility] RN services. He states his PCP is @ [facility]. CM did discuss with pt about [facility] and he refused at this time.   Anticipated DC Date:  04/23/2011   Anticipated DC Plan:  HOME/SELF CARE      DC Planning Services  CM consult      Choice offered to / List presented to:             Status of service:  Completed, signed off Medicare Important Message given?   (If response is "NO", the following Medicare IM given date fields will be blank) Date Medicare IM given:   Date Additional Medicare IM given:    Discharge Disposition:  HOME/SELF CARE  Per UR Regulation:    Comments:  04-21-11 1550 Tomi Bamberger, RN,BSN [phone] No other needs assessed by CM @ this time. Will cotinue to monitor.

## 2011-04-22 NOTE — Progress Notes (Signed)
I have seen and examined the patient. I agree with the above note with the addition of his main issue seems to be gout in his right foot at this time. He Is not able to put pressure on it. His chest pain is reasonably controlled. Blood pressure is also controlled now. I will increase the dose of colchicine to 4 times daily. If there is no significant improvement by tomorrow, he might need rheumatology consultation.  Lorine Bears 04/22/2011 6:11 PM

## 2011-04-22 NOTE — Progress Notes (Signed)
Cardiac Rehab 1035 Pt still not able to bear weight from gout. Will continue to follow.Aster Eckrich DunlapRn

## 2011-04-22 NOTE — Progress Notes (Signed)
Patient Name: [name] Date of Encounter: 04/22/2011     Principal Problem:  *Chest pain Active Problems:  HYPERLIPIDEMIA-MIXED  HYPERTENSION, BENIGN  CAD, NATIVE VESSEL  Hypokalemia  Pain, joint, ankle and foot    SUBJECTIVE: Pt reports no acute events overnight.   Continues to report gout pain in the R foot and L hand.  Also reports "shoulder pain" he believes to be further gout (tender).  Unable to fully ambulate w/ rehab 2/2 foot pain.   CURRENT MEDS    . amLODipine  10 mg Oral Daily  . aspirin EC  81 mg Oral Daily  . carvedilol  12.5 mg Oral NOW  . carvedilol  25 mg Oral BID WC  . clopidogrel  75 mg Oral Daily  . colchicine  0.6 mg Oral BID  . influenza  inactive virus vaccine  0.5 mL Intramuscular Tomorrow-1000  . lisinopril  40 mg Oral Daily  . pneumococcal 23 valent vaccine  0.5 mL Intramuscular Tomorrow-1000  . potassium chloride  20 mEq Oral BID  . rosuvastatin  20 mg Oral q1800    OBJECTIVE  Filed Vitals:   04/21/11 1447 04/21/11 2040 04/21/11 2230 04/22/11 0443  BP: 144/77 114/72  133/67  Pulse: 81 82  83  Temp: 99.8 F (37.7 C) 102 F (38.9 C) 99.2 F (37.3 C) 101.1 F (38.4 C)  TempSrc: Oral Oral Oral Oral  Resp: 19 20  20   Height:      Weight:      SpO2: 97% 92%  95%    Intake/Output Summary (Last 24 hours) at 04/22/11 0932 Last data filed at 04/21/11 1900  Gross per 24 hour  Intake  865.5 ml  Output    400 ml  Net  465.5 ml    PHYSICAL EXAM  General: Well developed, well nourished, NAD  Head: Normocephalic, atraumatic, sclera non-icteric  Neck: Supple without bruits or JVD.  Lungs: Resp regular and unlabored, diminished breath sounds bliat - more diminished in upper airways. Heart: RRR no s3, s4, or murmurs, heaves, thrills, rubs or gallops  Abdomen: Soft, non-tender, non-distended, BS + x 4.  Msk: Strength and tone appears normal for age. R ankle with trace nonpitting edema, left 1st digit mildly swollen with calor and  tender to palp.  L sternoclavicular joint tender, non edematous.  Full active and passive ROM in L shoulder.   Extremities: No clubbing, cyanosis or edema. DP/PT/Radials 2+ and equal bilaterally.  Neuro: Alert and oriented X 3. Moves all extremities spontaneously.  Psych: Pleasant, normal affect  LABS: Uric Acid, Serum 04/21/11: 7.1 mg/dL. (ref range 4-7.8 mg/dL)  CBC:  Basename 04/54/09 0525 04/21/11 0533 04/19/11 1322  WBC 10.7* 10.7* --  NEUTROABS -- -- 5.8  HGB 10.7* 11.3* --  HCT 33.4* 34.2* --  MCV 82.3 81.4 --  PLT 263 259 --   Basic Metabolic Panel:  Basename 04/21/11 0533 04/20/11 0238  NA 137 136  K 3.3* 3.4*  CL 96 98  CO2 28 26  GLUCOSE 122* 128*  BUN 8 6  CREATININE 0.81 0.76  CALCIUM 8.8 8.5  MG -- --  PHOS -- --   Cardiac Enzymes:  Basename 04/20/11 0859 04/20/11 0251 04/19/11 1924  CKTOTAL 70 75 101  CKMB 1.2 1.1 1.3  CKMBINDEX -- -- --  TROPONINI <0.30 <0.30 <0.30   BNP:  Basename 04/19/11 1318  POCBNP 609.0*   Fasting Lipid Panel:  Basename 04/20/11 0238  CHOL 124  HDL 58  LDLCALC  44  TRIG 108  CHOLHDL 2.1  LDLDIRECT --    TELE: SR with PVCs overnight, no acute events.   ECG: SR without changes from 12/4 EKG.    Radiology/Studies:  Dg Chest 2 View  04/19/2011  *RADIOLOGY REPORT*  Clinical Data: Cough.  Rule out pneumonia  CHEST - 2 VIEW  Comparison: 08/02/2009  Findings: The lungs are hyperinflated and there are coarsened interstitial markings of COPD.  Advanced bullous emphysema is noted which appears most evident in the right lung apex as before appear  Right midlung scarring is again noted.  The left lung is clear.  No pleural effusions or pulmonary edema.  Heart size is normal.  IMPRESSION:  1.  No evidence for pneumonia. 2.  Chronic pulmonary parenchymal changes of emphysema.  Original Report Authenticated By: Rosealee Albee, M.D.    ASSESSMENT AND PLAN:  1. Chest pain- Pt with nonobs cath and patent rca stents. Only chest  pain is point tenderness at the left sternoclavicular joint. Pt attributes this to his ongoing gout flare.  He's had no other CP and has been doing cardiac rehab.   2. HTN- Systolic trends 110's-140's now on max Coreg, lisinopril, and amlodipine.  Can f/u as outpatient.  3. Acute on Chronic Gout Flare- acute last night/this morning, nontraumatic, suspected gout vs OA.  Uric acid normal but can be normal in the setting of gout.  Continue Colchicine, which has helped. Tylenol, Ultram, and add Ibuprofen PRNs. Can f/u outpatient.  4. Fever/leukocytosis-Pt has had progressive leukocytosis since admission.  TMax last night 102.  Both these could be the result of an ongoing gout flare.  His admission CXR did not suggest pneumonia though his COPD makes him more susceptible.  He's asymptomatic for any Ss of PNA, though breath sounds are diminished.  Will repeat CXR.  Check UA.  If fever spikes again, will check blood cultures. 5. Hypokalemia- repeat K+ this am. Supp yesterday   Signed, Nicolasa Ducking NP

## 2011-04-22 NOTE — Progress Notes (Signed)
Utilization review completed. Catrina Fellenz, RN, BSN. 04/22/11  

## 2011-04-23 ENCOUNTER — Encounter (HOSPITAL_COMMUNITY): Payer: Self-pay | Admitting: Nurse Practitioner

## 2011-04-23 DIAGNOSIS — M109 Gout, unspecified: Secondary | ICD-10-CM | POA: Insufficient documentation

## 2011-04-23 LAB — CBC
HCT: 33.5 % — ABNORMAL LOW (ref 39.0–52.0)
MCH: 26 pg (ref 26.0–34.0)
MCV: 82.1 fL (ref 78.0–100.0)
Platelets: 283 10*3/uL (ref 150–400)
RDW: 14.9 % (ref 11.5–15.5)

## 2011-04-23 LAB — BASIC METABOLIC PANEL
BUN: 13 mg/dL (ref 6–23)
CO2: 27 mEq/L (ref 19–32)
Calcium: 8.8 mg/dL (ref 8.4–10.5)
Creatinine, Ser: 0.79 mg/dL (ref 0.50–1.35)

## 2011-04-23 MED ORDER — PANTOPRAZOLE SODIUM 40 MG PO TBEC
40.0000 mg | DELAYED_RELEASE_TABLET | Freq: Every day | ORAL | Status: DC
  Administered 2011-04-23: 40 mg via ORAL
  Filled 2011-04-23: qty 1

## 2011-04-23 MED ORDER — AMLODIPINE BESYLATE 10 MG PO TABS: 10.0000 mg | ORAL_TABLET | Freq: Every day | ORAL | Status: DC

## 2011-04-23 MED ORDER — COLCHICINE 0.6 MG PO TABS: 0.6000 mg | ORAL_TABLET | Freq: Three times a day (TID) | ORAL | Status: DC

## 2011-04-23 MED ORDER — IBUPROFEN 600 MG PO TABS: 600.0000 mg | ORAL_TABLET | Freq: Three times a day (TID) | ORAL | Status: AC

## 2011-04-23 MED ORDER — CARVEDILOL 25 MG PO TABS: 25.0000 mg | ORAL_TABLET | Freq: Two times a day (BID) | ORAL | Status: DC

## 2011-04-23 MED ORDER — ALLOPURINOL 100 MG PO TABS: 100.0000 mg | ORAL_TABLET | Freq: Every day | ORAL | Status: DC

## 2011-04-23 MED ORDER — POTASSIUM CHLORIDE CRYS ER 20 MEQ PO TBCR: 20.0000 meq | EXTENDED_RELEASE_TABLET | Freq: Every day | ORAL | Status: DC

## 2011-04-23 MED ORDER — PANTOPRAZOLE SODIUM 40 MG PO TBEC: 40.0000 mg | DELAYED_RELEASE_TABLET | Freq: Every day | ORAL | Status: DC

## 2011-04-23 MED ORDER — IBUPROFEN 600 MG PO TABS
600.0000 mg | ORAL_TABLET | Freq: Three times a day (TID) | ORAL | Status: DC
  Administered 2011-04-23: 600 mg via ORAL
  Filled 2011-04-23 (×4): qty 1

## 2011-04-23 NOTE — Progress Notes (Signed)
Patient Name: [name] Date of Encounter: 04/23/2011     Principal Problem:  *Chest pain Active Problems:  HYPERLIPIDEMIA-MIXED  HYPERTENSION, BENIGN  CAD, NATIVE VESSEL  Hypokalemia  Pain, joint, ankle and foot    SUBJECTIVE: Pt reports feeling better on increased Colchicine.  He can ambulate to the bathroom and bare minimal weight on R foot while leaning on sink for support.  Says his shoulder/chest pain has improved too.  No events overnight.    CURRENT MEDS    . amLODipine  10 mg Oral Daily  . aspirin EC  81 mg Oral Daily  . carvedilol  25 mg Oral BID WC  . clopidogrel  75 mg Oral Daily  . colchicine  0.6 mg Oral Q6H  . lisinopril  40 mg Oral Daily  . potassium chloride  20 mEq Oral BID  . rosuvastatin  20 mg Oral q1800  . DISCONTD: colchicine  0.6 mg Oral BID    OBJECTIVE  Filed Vitals:   05/10/11 1336 May 10, 2011 1828 05/10/2011 2141 04/23/11 0622  BP: 130/77  113/70 158/100  Pulse: 80  75 83  Temp: 99.5 F (37.5 C) 99.9 F (37.7 C) 99.2 F (37.3 C) 99.4 F (37.4 C)  TempSrc: Oral Oral Oral Oral  Resp: 20  20 19   Height:      Weight:      SpO2: 96%  93% 94%    Intake/Output Summary (Last 24 hours) at 04/23/11 0756 Last data filed at 04/23/11 0102  Gross per 24 hour  Intake   1800 ml  Output   1250 ml  Net    550 ml   Weight change:   PHYSICAL EXAM  General: Well developed, well nourished, NAD  Head: Normocephalic, atraumatic, sclera non-icteric  Neck: Supple without bruits or JVD.  Lungs: Resp regular and unlabored, diminished breath sounds bliat - more diminished in upper airways.  Heart: RRR no s3, s4, or murmurs, heaves, thrills, rubs or gallops  Abdomen: Soft, non-tender, non-distended, BS + x 4.  Msk: Strength and tone appears normal for age. R ankle with trace nonpitting edema, left 1st digit mildly swollen with calor and tender to palp. L sternoclavicular joint less tender than yesterday's exam, non edematous. Full active and  passive ROM in L shoulder.  Extremities: No clubbing, cyanosis or edema. DP/PT/Radials 2+ and equal bilaterally.  Neuro: Alert and oriented X 3. Moves all extremities spontaneously.  Psych: Pleasant, normal affect  LABS:  CBC:  Basename 04/23/11 0650 May 10, 2011 0525  WBC 8.1 10.7*  NEUTROABS -- --  HGB 10.6* 10.7*  HCT 33.5* 33.4*  MCV 82.1 82.3  PLT 283 263   Basic Metabolic Panel:  Basename 04/23/11 0650 2011/05/10 1230  NA 134* 132*  K 3.6 3.6  CL 96 93*  CO2 27 27  GLUCOSE 123* 115*  BUN 13 16  CREATININE 0.79 0.87  CALCIUM 8.8 9.0  MG -- --  PHOS -- --   Cardiac Enzymes:  Basename 04/20/11 0859  CKTOTAL 70  CKMB 1.2  CKMBINDEX --  TROPONINI <0.30    TELE: SR with PVC's.  No acute events overnight.   Radiology/Studies:   Dg Chest Port 1 View  05-10-11  *RADIOLOGY REPORT*  Clinical Data: Short of breath and cough.  Rule out pneumonia  PORTABLE CHEST - 1 VIEW  Comparison: 04/19/2011  Findings: Large right apical bleb is unchanged from prior studies. Mild apical bleb formation.  Negative for pneumonia.  Negative for heart failure or effusion.  Heart size is normal.  IMPRESSION: Chronic lung disease with apical bullous emphysema, right greater than left.  No superimposed acute abnormality.  Original Report Authenticated By: Camelia Phenes, M.D.      ASSESSMENT AND PLAN: 1. Chest pain- Pt with nonobs cath and patent rca stents. Only chest pain is point tenderness at the left sternoclavicular joint, which has improved. Pt attributes this to his ongoing gout flare but says its improved since yesterday. He's had no other CP and has been doing cardiac rehab.  2. HTN- BP high this morning now on max Coreg, lisinopril, and amlodipine. Can f/u as outpatient - trended well throughout the day yesterday. 3. Acute on Chronic Gout Flare- improving.  Continue Colchicine.  Add nsaid, which pt says helps @ home. (add ppi).  Add allopurinol for future prevention.  Uric acid High  Normal. 4. Fever/leukocytosis-Pt continues to have low grade fever on anti-pyretics.  His leukocytosis has resolved by this morning's labs.  His CXR showed no signs of PNA.  UA without signs of infection.  Fever could be due to ongoing gout.  5. Hypokalemia- stabilized after KCl supplements. 6.  Dispo:  Home today     Signed, Nicolasa Ducking NP

## 2011-04-23 NOTE — Discharge Summary (Signed)
I have seen and examined the patient. I agree with the above.  Lorine Bears 04/23/2011 1:57 PM

## 2011-04-23 NOTE — Discharge Summary (Signed)
Patient ID: [name],  MRN: [id number], DOB/AGE: 1950/09/21 60 y.o.  Admit date: 04/19/2011 Discharge date: 04/23/2011  Primary Cardiologist: Tonny Bollman  Discharge Diagnoses Principal Problem:  *Chest pain Active Problems:  HYPERLIPIDEMIA-MIXED  HYPERTENSION, BENIGN  CAD, NATIVE VESSEL  Hypokalemia  Pain, joint, ankle and foot  Gout   Allergies No Known Allergies  Procedures  Coronary dominance: right  Left mainstem: Normal  Left anterior descending (LAD): This is a large vessel which extends around the apex. In the mid vessel there is 40% narrowing.  There is a moderate-sized ramus intermediate branch which has 50-60% disease.  Left circumflex (LCx): There is a focal ostial narrowing of 50-60%. The stented segment in the proximal vessel is widely patent. The first obtuse marginal vessel has a segmental 70% stenosis.  Right coronary artery (RCA): This is a large dominant vessel. The stents are widely patent. There is 20-30% disease proximal to the stent and in the distal vessel.  Left ventriculography: Left ventricular systolic function is normal, LVEF is estimated at 55-65%, there is no significant mitral regurgitation  Final Conclusions:  1. Continued excellent patency of the stents in the right coronary and the proximal left circumflex coronary. Patient does have moderate stenosis in the first obtuse marginal vessel. There is also some focal narrowing at the ostium of the circumflex. The disease in the marginal branch and the ramus intermediate branch did not appear to be significantly changed from 2009.  2. Normal left ventricular function.   History of Present Illness  60 y/o male with prior history of CAD s/p nstemi and pci RCA/LCX in 10/2007 who presented to the [facility] ED on 04/19/2011 with complaints of constant, sharp, chest pain.  In the ED, his EKG was non-acute and enzymes were negative.  He was admitted for further eval.  Hospital Course   Pt r/o for MI.   Decision was made to pursue diagnostic cath as pt cont to have intermittent c/p, which was reproducible with chest wall palpation.  Diagnostic cath was performed on 12/4 revealing patent LCX and RCA stents with otherwise nonobstructive disease and normal LV function.  Post cath, pt cont to complain of chest wall tenderness along with a new complaint of right ankle and foot pain consistent with gout flare.  He was placed on colchicine therapy with improvement in symptoms.  He was also noted to have fevers ranging from 99-102.8.  He had no localizing symptoms (other than gout) and cxr and ua were within normal limits.  Fevers have improved and pt will be d/c today in good condition.  Discharge Vitals:  Blood pressure 143/81, pulse 83, temperature 99.4 F (37.4 C), temperature source Oral, resp. rate 19, height 6\' 1"  (1.854 m), weight 180 lb (81.647 kg), SpO2 94.00%.   Labs: CBC:  Basename 04/23/11 0650 04/22/11 0525  WBC 8.1 10.7*  NEUTROABS -- --  HGB 10.6* 10.7*  HCT 33.5* 33.4*  MCV 82.1 82.3  PLT 283 263   Basic Metabolic Panel:  Basename 04/23/11 0650 04/22/11 1230  NA 134* 132*  K 3.6 3.6  CL 96 93*  CO2 27 27  GLUCOSE 123* 115*  BUN 13 16  CREATININE 0.79 0.87  CALCIUM 8.8 9.0  MG -- --  PHOS -- --   Disposition:  Follow-up Information    Follow up with Tereso Newcomer, PA on 05/13/2011. (11:30)    Contact information:   Dr. Earmon [place] Office [facility]) [address] [place] [address] [phone]  Discharge Medications: Current Discharge Medication List    START taking these medications   Details  allopurinol (ZYLOPRIM) 100 MG tablet Take 1 tablet (100 mg total) by mouth daily. Qty: 30 tablet, Refills: 1    carvedilol (COREG) 25 MG tablet Take 1 tablet (25 mg total) by mouth 2 (two) times daily with a meal. Qty: 60 tablet, Refills: 6    colchicine 0.6 MG tablet Take 1 tablet (0.6 mg total) by mouth 3 (three)  times daily. Qty: 60 tablet, Refills: 2    ibuprofen (ADVIL,MOTRIN) 600 MG tablet Take 1 tablet (600 mg total) by mouth 4 (four) times daily -  before meals and at bedtime. Qty: 30 tablet    pantoprazole (PROTONIX) 40 MG tablet Take 1 tablet (40 mg total) by mouth daily at 6 (six) AM. Qty: 30 tablet, Refills: 3    potassium chloride SA (K-DUR,KLOR-CON) 20 MEQ tablet Take 1 tablet (20 mEq total) by mouth daily. Qty: 3 tablet, Refills: 3      CONTINUE these medications which have CHANGED   Details  amLODipine (NORVASC) 10 MG tablet Take 1 tablet (10 mg total) by mouth daily. Qty: 30 tablet, Refills: 6      CONTINUE these medications which have NOT CHANGED   Details  aspirin 81 MG tablet Take 81 mg by mouth daily.      atorvastatin (LIPITOR) 40 MG tablet Take 40 mg by mouth daily.      clopidogrel (PLAVIX) 75 MG tablet Take 75 mg by mouth daily.      lisinopril (PRINIVIL,ZESTRIL) 40 MG tablet Take 40 mg by mouth daily.      nitroGLYCERIN (NITROSTAT) 0.4 MG SL tablet Place 0.4 mg under the tongue every 5 (five) minutes x 3 doses as needed. For shortness of breath    tadalafil (CIALIS) 10 MG tablet Take 10 mg by mouth daily as needed. For erectile dysfunction       STOP taking these medications     hydrochlorothiazide (MICROZIDE) 12.5 MG capsule         Outstanding Labs/Studies  None  Duration of Discharge Encounter: Greater than 30 minutes including physician time.  Signed, Nicolasa Ducking NP 04/23/2011, 12:14 PM

## 2011-05-13 ENCOUNTER — Ambulatory Visit (INDEPENDENT_AMBULATORY_CARE_PROVIDER_SITE_OTHER): Payer: Medicare Other | Admitting: Physician Assistant

## 2011-05-13 ENCOUNTER — Encounter: Payer: Self-pay | Admitting: Physician Assistant

## 2011-05-13 DIAGNOSIS — M109 Gout, unspecified: Secondary | ICD-10-CM

## 2011-05-13 DIAGNOSIS — R0602 Shortness of breath: Secondary | ICD-10-CM

## 2011-05-13 DIAGNOSIS — R609 Edema, unspecified: Secondary | ICD-10-CM | POA: Insufficient documentation

## 2011-05-13 DIAGNOSIS — E785 Hyperlipidemia, unspecified: Secondary | ICD-10-CM

## 2011-05-13 DIAGNOSIS — I2510 Atherosclerotic heart disease of native coronary artery without angina pectoris: Secondary | ICD-10-CM

## 2011-05-13 DIAGNOSIS — I10 Essential (primary) hypertension: Secondary | ICD-10-CM

## 2011-05-13 MED ORDER — FUROSEMIDE 20 MG PO TABS: ORAL_TABLET | ORAL | Status: DC

## 2011-05-13 MED ORDER — POTASSIUM CHLORIDE ER 10 MEQ PO CPCR: ORAL_CAPSULE | ORAL | Status: DC

## 2011-05-13 NOTE — Assessment & Plan Note (Signed)
Etiology not entirely clear.  Question if this is all related to gout flare.  I have recommended that he followup with his PCP for gout.  Other than his ankle edema, I do not see any evidence of congestive heart failure.  He had normal LV function his heart catheterization.  I will check a basic metabolic panel and BNP today.  I will place him on Lasix 20 mg daily and potassium 10 mEq daily for 3 days.  He'll take both Lasix and potassium daily p.r.n. Edema after this.  I will also check a TSH.  His edema is asymmetric.  I will set him up for bilateral venous Dopplers to rule out the possibility of DVT.  He will followup in 2 weeks.

## 2011-05-13 NOTE — Assessment & Plan Note (Signed)
As noted, I have asked him to followup with his PCP.  Decrease colchicine once daily.  We discussed reducing and Eliminating a high purine diet.

## 2011-05-13 NOTE — Patient Instructions (Signed)
Your physician has recommended you make the following change in your medication: Start Furosemide 20 mg, one tablet every day for 3 days; THEN 1 tablet daily AS NEEDED for EDEMA.  Start Potassium 10 mEq, one tablet every day for 3 days; THEN 1 tablet daily AS NEEDED, taking with the Furosemide.    DECREASE Colchicine to 1 tablet daily. Please follow up with your Primary Care Physician or family medicine for management of your gout.    Your physician has requested that you have a lower extremity venous duplex. This test is an ultrasound of the veins in the legs. It looks at venous blood flow that carries blood from the heart to the legs. Allow one hour for a Lower Venous exam.  There are no restrictions or special instructions. Please have this scheduled within the next week   Your physician recommends that you schedule a follow-up appointment in: 2 weeks with Tereso Newcomer, PA on a day that Dr. Excell Seltzer is also in the office.    Please have labs done today;  BMET, BNP, TSH (Dx: 782.3, 414.01)  Your physician recommends that you return for lab work in: 1 week: BMET Dx: 414.01

## 2011-05-13 NOTE — Assessment & Plan Note (Signed)
Somewhat elevated blood pressure today.  I will see how his blood pressure response to diuresis.  Of note, he has been on Norvasc for quite some time and I do not suspect this is the cause of his edema.

## 2011-05-13 NOTE — Progress Notes (Signed)
[address] [place], [place]  [address] Phone: [phone] Fax:  [phone]  Date:  05/13/2011   Name:  [name]       DOB:  [birth year] MRN:  [id number]  PCP:  He is followed at the [facility] Primary Cardiologist:  Dr. Tonny Bollman  Primary Electrophysiologist:  None    History of Present Illness: [name] is a 60 y.o. male who presents for post hospital follow up.  He has a history of CAD, status post NSTEMI and PCI of the RCA and circumflex in 6/09 with DES.  He was admitted 12/3-12/7 with chest pain.  Myocardial infarction was ruled out.  LHC 04/20/11: mLAD 40%, RI 50-60%, oCFX 50-60%, pCFX stent ok, OM1 70%, RCA stents ok with 20-30% before stent and in dRCA, EF 55-65%, no change from 2009 and medical tx continued.  He developed a gout flare.  He was placed on colchicine.  He developed fevers.  Chest x-ray and urinalysis were both unremarkable.  His fever improved and he was discharged home.  Labs: Hemoglobin 10.6, potassium 3.6, creatinine 0.79, pro BNP 609, uric acid 7.1, TC 124, TG 108, HDL 50, LDL 44.  Since d/c, he feels well.  The patient denies chest pain, shortness of breath, syncope, orthopnea, PND.  Gout pain is almost resolved.  He was discharged on colchicine TID.  Still taking this dose.   Notes significant ankle edema, R>L since hospitalization.  Somewhat improved in am and worse in afternoon.  Of note, he had normal ABI's in 2011.    Past Medical History  Diagnosis Date  . Coronary artery disease     s/p NSTEMI 10/2007, RCA and LCx PCI with drug-eluting stents; patent stents cath 04/2011  . Hypertension     Poorly controlled  . DJD (degenerative joint disease)   . Hypercholesterolemia   . Myocardial infarction   . Emphysema   . Angina   . COPD (chronic obstructive pulmonary disease)   . Shortness of breath   . Pneumonia   . Gout     Current Outpatient Prescriptions  Medication Sig Dispense Refill  .  allopurinol (ZYLOPRIM) 100 MG tablet Take 1 tablet (100 mg total) by mouth daily.  30 tablet  1  . amLODipine (NORVASC) 10 MG tablet Take 1 tablet (10 mg total) by mouth daily.  30 tablet  6  . aspirin 81 MG tablet Take 81 mg by mouth daily.        Marland Kitchen atorvastatin (LIPITOR) 40 MG tablet Take 40 mg by mouth daily.        . carvedilol (COREG) 25 MG tablet Take 1 tablet (25 mg total) by mouth 2 (two) times daily with a meal.  60 tablet  6  . clopidogrel (PLAVIX) 75 MG tablet Take 75 mg by mouth daily.        . colchicine 0.6 MG tablet Take 1 tablet (0.6 mg total) by mouth 3 (three) times daily.  60 tablet  2  . lisinopril (PRINIVIL,ZESTRIL) 40 MG tablet Take 40 mg by mouth daily.        . nitroGLYCERIN (NITROSTAT) 0.4 MG SL tablet Place 0.4 mg under the tongue every 5 (five) minutes x 3 doses as needed. For shortness of breath      . pantoprazole (PROTONIX) 40 MG tablet Take 1 tablet (40 mg total) by mouth daily at 6 (six) AM.  30 tablet  3  . potassium chloride SA (K-DUR,KLOR-CON) 20  MEQ tablet Take 1 tablet (20 mEq total) by mouth daily.  3 tablet  3  . tadalafil (CIALIS) 10 MG tablet Take 10 mg by mouth daily as needed. For erectile dysfunction         Allergies: No Known Allergies  History  Substance Use Topics  . Smoking status: Former Smoker -- 1.0 packs/day for 30 years    Types: Cigarettes    Quit date: 05/17/2005  . Smokeless tobacco: Not on file  . Alcohol Use: No     ROS:  Please see the history of present illness.   All other systems reviewed and negative.   PHYSICAL EXAM: VS:  BP 142/92  Pulse 62  Ht 6\' 1"  (1.854 m)  Wt 186 lb (84.369 kg)  BMI 24.54 kg/m2 Well nourished, well developed, in no acute distress HEENT: normal Neck: no JVD Cardiac:  normal S1, S2; RRR; no murmur Lungs:  clear to auscultation bilaterally, no wheezing, rhonchi or rales Abd: soft, nontender, no hepatomegaly Ext: 1-2+ bilateral ankle edema; R>>L Skin: warm and dry Neuro:  CNs 2-12 intact, no  focal abnormalities noted  EKG:   Sinus rhythm, heart rate 62, left axis deviation, nonspecific ST-T wave changes  ASSESSMENT AND PLAN:

## 2011-05-13 NOTE — Assessment & Plan Note (Signed)
Lab Results  Component Value Date   CHOL 124 04/20/2011   HDL 58 04/20/2011   LDLCALC 44 04/20/2011   LDLDIRECT 149.1 09/02/2010   TRIG 108 04/20/2011   CHOLHDL 2.1 04/20/2011    Continue current therapy

## 2011-05-13 NOTE — Assessment & Plan Note (Addendum)
Recent heart catheterization with stable anatomy.  Continue aspirin, Plavix and statin.

## 2011-05-14 ENCOUNTER — Other Ambulatory Visit: Payer: Self-pay | Admitting: Cardiovascular Disease

## 2011-05-20 ENCOUNTER — Encounter (INDEPENDENT_AMBULATORY_CARE_PROVIDER_SITE_OTHER): Payer: Medicare Other | Admitting: *Deleted

## 2011-05-20 ENCOUNTER — Other Ambulatory Visit: Payer: Self-pay | Admitting: Cardiovascular Disease

## 2011-05-20 ENCOUNTER — Other Ambulatory Visit (INDEPENDENT_AMBULATORY_CARE_PROVIDER_SITE_OTHER): Payer: Medicare Other | Admitting: *Deleted

## 2011-05-20 DIAGNOSIS — I10 Essential (primary) hypertension: Secondary | ICD-10-CM

## 2011-05-20 DIAGNOSIS — R609 Edema, unspecified: Secondary | ICD-10-CM

## 2011-05-20 DIAGNOSIS — R0602 Shortness of breath: Secondary | ICD-10-CM

## 2011-05-20 DIAGNOSIS — I2510 Atherosclerotic heart disease of native coronary artery without angina pectoris: Secondary | ICD-10-CM

## 2011-05-20 DIAGNOSIS — E7800 Pure hypercholesterolemia, unspecified: Secondary | ICD-10-CM

## 2011-05-20 LAB — HEPATIC FUNCTION PANEL
Alkaline Phosphatase: 56 U/L (ref 39–117)
Bilirubin, Direct: 0 mg/dL (ref 0.0–0.3)
Total Bilirubin: 0.5 mg/dL (ref 0.3–1.2)
Total Protein: 8.1 g/dL (ref 6.0–8.3)

## 2011-05-20 LAB — BASIC METABOLIC PANEL
BUN: 12 mg/dL (ref 6–23)
CO2: 29 mEq/L (ref 19–32)
Chloride: 102 mEq/L (ref 96–112)
Potassium: 4.2 mEq/L (ref 3.5–5.1)

## 2011-05-20 LAB — LIPID PANEL
Cholesterol: 143 mg/dL (ref 0–200)
LDL Cholesterol: 52 mg/dL (ref 0–99)

## 2011-05-21 ENCOUNTER — Encounter: Payer: Self-pay | Admitting: Physician Assistant

## 2011-05-24 ENCOUNTER — Encounter: Payer: Self-pay | Admitting: Physician Assistant

## 2011-05-24 ENCOUNTER — Ambulatory Visit (INDEPENDENT_AMBULATORY_CARE_PROVIDER_SITE_OTHER): Payer: Medicare Other | Admitting: Physician Assistant

## 2011-05-24 DIAGNOSIS — M109 Gout, unspecified: Secondary | ICD-10-CM

## 2011-05-24 DIAGNOSIS — E785 Hyperlipidemia, unspecified: Secondary | ICD-10-CM

## 2011-05-24 DIAGNOSIS — I2510 Atherosclerotic heart disease of native coronary artery without angina pectoris: Secondary | ICD-10-CM

## 2011-05-24 DIAGNOSIS — R609 Edema, unspecified: Secondary | ICD-10-CM

## 2011-05-24 DIAGNOSIS — I10 Essential (primary) hypertension: Secondary | ICD-10-CM

## 2011-05-24 NOTE — Progress Notes (Signed)
[address] [place], [place]  [address] Phone: [phone] Fax:  [phone]  Date:  05/24/2011   Name:  [name]       DOB:  January 03, 1951 MRN:  [id number]  PCP:  He is followed at the [facility] Primary Cardiologist:  Dr. Tonny Bollman  Primary Electrophysiologist:  None    History of Present Illness: [name] is a 61 y.o. male who presents for follow up.  He has a history of CAD, status post NSTEMI and PCI of the RCA and circumflex in 6/09 with DES.  He was admitted 12/3-12/7 with chest pain.  Myocardial infarction was ruled out.  LHC 04/20/11: mLAD 40%, RI 50-60%, oCFX 50-60%, pCFX stent ok, OM1 70%, RCA stents ok with 20-30% before stent and in dRCA, EF 55-65%, no change from 2009 and medical tx continued.  He developed a gout flare and was placed on colchicine.  Labs: Hemoglobin 10.6, potassium 3.6, creatinine 0.79, pro BNP 609, uric acid 7.1, TC 124, TG 108, HDL 50, LDL 44.  I saw him in follow up 2 weeks ago and he noted significant ankle edema, R>L since hospitalization.  Somewhat improved in am and worse in afternoon.  Of note, he had normal ABI's in 2011.  BMET, BNP, TSH and LE venous dopplers were ordered.  TSH and BNP were not done.  Dopplers were neg for DVT bilaterally.  Labs:  K 4.2, creatinine 1.0.  He is doing well.  Edema is improved.  The patient denies chest pain, shortness of breath, syncope, orthopnea, PND.   Past Medical History  Diagnosis Date  . Coronary artery disease     s/p NSTEMI 10/2007, RCA and LCx PCI with drug-eluting stents; patent stents cath 04/2011  . Hypertension     Poorly controlled  . DJD (degenerative joint disease)   . Hypercholesterolemia   . Myocardial infarction   . Emphysema   . Angina   . COPD (chronic obstructive pulmonary disease)   . Shortness of breath   . Pneumonia   . Gout     Current Outpatient Prescriptions  Medication Sig Dispense Refill  . allopurinol (ZYLOPRIM) 100 MG  tablet Take 1 tablet (100 mg total) by mouth daily.  30 tablet  1  . amLODipine (NORVASC) 10 MG tablet Take 1 tablet (10 mg total) by mouth daily.  30 tablet  6  . aspirin 81 MG tablet Take 81 mg by mouth daily.        Marland Kitchen atorvastatin (LIPITOR) 40 MG tablet Take 40 mg by mouth daily.        . carvedilol (COREG) 25 MG tablet Take 1 tablet (25 mg total) by mouth 2 (two) times daily with a meal.  60 tablet  6  . CIALIS 10 MG tablet TAKE 1 TABLET BY MOUTH AS DIRECTED  12 tablet  2  . clopidogrel (PLAVIX) 75 MG tablet Take 75 mg by mouth daily.        . colchicine 0.6 MG tablet Take 0.6 mg by mouth daily.        . furosemide (LASIX) 20 MG tablet Take one tablet once daily for 3 days;THEN take one tablet daily AS NEEDED for swelling  30 tablet  3  . lisinopril (PRINIVIL,ZESTRIL) 40 MG tablet Take 40 mg by mouth daily.        . nitroGLYCERIN (NITROSTAT) 0.4 MG SL tablet Place 0.4 mg under the tongue every 5 (five) minutes x 3 doses  as needed. For shortness of breath      . pantoprazole (PROTONIX) 40 MG tablet Take 1 tablet (40 mg total) by mouth daily at 6 (six) AM.  30 tablet  3  . potassium chloride (MICRO-K) 10 MEQ CR capsule Take one capsule daily for 3 days; THEN take one capsule daily AS NEEDED for swelling; take with furosemide  30 capsule  3    Allergies: No Known Allergies  History  Substance Use Topics  . Smoking status: Former Smoker -- 1.0 packs/day for 30 years    Types: Cigarettes    Quit date: 05/17/2005  . Smokeless tobacco: Not on file  . Alcohol Use: No     PHYSICAL EXAM: VS:  BP 118/70  Pulse 57  Ht 6\' 1"  (1.854 m)  Wt 183 lb (83.008 kg)  BMI 24.14 kg/m2 Well nourished, well developed, in no acute distress HEENT: normal Neck: no JVD Cardiac:  normal S1, S2; RRR; no murmur Lungs:  clear to auscultation bilaterally, no wheezing, rhonchi or rales Abd: soft, nontender, no hepatomegaly Ext: 1+ bilateral ankle edema; R>>L Skin: warm and dry Neuro:  CNs 2-12 intact, no  focal abnormalities noted  ASSESSMENT AND PLAN:

## 2011-05-24 NOTE — Patient Instructions (Addendum)
Your physician wants you to follow-up in: 6 months with Dr Excell Seltzer.  You will receive a reminder letter in the mail two months in advance. If you don't receive a letter, please call our office to schedule the follow-up appointment.  You have been referred to [facility], Dr Yetta Barre, at [address] [place]. [place], [place] [address].  [phone].

## 2011-05-24 NOTE — Assessment & Plan Note (Signed)
Improved.  Likely related to venous insufficiency.  Also, has old injury to left ankle.  I suggested he get some OTC compression socks.  Continue Lasix.  As noted, dopplers neg.

## 2011-05-24 NOTE — Assessment & Plan Note (Addendum)
Stable.  No chest pain.  Continue DAPT.  Follow up with Dr. Tonny Bollman in 6 mos.

## 2011-05-24 NOTE — Assessment & Plan Note (Signed)
He wants a new PCP.  Will refer to PCP.

## 2011-05-24 NOTE — Assessment & Plan Note (Signed)
Controlled.  Continue current therapy.  

## 2011-05-24 NOTE — Assessment & Plan Note (Signed)
Lab Results  Component Value Date   CHOL 143 05/20/2011   HDL 68.10 05/20/2011   LDLCALC 52 05/20/2011   LDLDIRECT 149.1 09/02/2010   TRIG 115.0 05/20/2011   CHOLHDL 2 05/20/2011    Continue current Rx.

## 2011-07-06 ENCOUNTER — Other Ambulatory Visit: Payer: Self-pay | Admitting: Nurse Practitioner

## 2011-09-13 ENCOUNTER — Other Ambulatory Visit: Payer: Self-pay

## 2011-09-13 ENCOUNTER — Other Ambulatory Visit: Payer: Self-pay | Admitting: Cardiovascular Disease

## 2011-09-13 ENCOUNTER — Other Ambulatory Visit: Payer: Self-pay | Admitting: Nurse Practitioner

## 2011-09-13 MED ORDER — PANTOPRAZOLE SODIUM 40 MG PO TBEC: 40.0000 mg | DELAYED_RELEASE_TABLET | Freq: Every day | ORAL | Status: DC

## 2011-09-27 ENCOUNTER — Other Ambulatory Visit: Payer: Self-pay | Admitting: Cardiovascular Disease

## 2011-10-07 ENCOUNTER — Other Ambulatory Visit: Payer: Self-pay | Admitting: Nurse Practitioner

## 2011-10-14 ENCOUNTER — Ambulatory Visit: Payer: Medicare Other | Admitting: Cardiovascular Disease

## 2011-10-20 ENCOUNTER — Other Ambulatory Visit: Payer: Self-pay | Admitting: Cardiovascular Disease

## 2011-10-20 ENCOUNTER — Other Ambulatory Visit: Payer: Self-pay | Admitting: Physician Assistant

## 2011-12-06 ENCOUNTER — Ambulatory Visit: Payer: Medicare Other | Admitting: Cardiovascular Disease

## 2011-12-07 ENCOUNTER — Other Ambulatory Visit: Payer: Self-pay | Admitting: Nurse Practitioner

## 2012-01-17 ENCOUNTER — Other Ambulatory Visit: Payer: Self-pay | Admitting: Cardiovascular Disease

## 2012-01-18 ENCOUNTER — Ambulatory Visit (INDEPENDENT_AMBULATORY_CARE_PROVIDER_SITE_OTHER): Payer: Medicare Other | Admitting: Cardiovascular Disease

## 2012-01-18 ENCOUNTER — Encounter: Payer: Self-pay | Admitting: Cardiovascular Disease

## 2012-01-18 VITALS — BP 170/100 | HR 86 | Ht 73.0 in | Wt 183.8 lb

## 2012-01-18 DIAGNOSIS — I10 Essential (primary) hypertension: Secondary | ICD-10-CM

## 2012-01-18 DIAGNOSIS — I2510 Atherosclerotic heart disease of native coronary artery without angina pectoris: Secondary | ICD-10-CM

## 2012-01-18 DIAGNOSIS — E785 Hyperlipidemia, unspecified: Secondary | ICD-10-CM

## 2012-01-18 NOTE — Progress Notes (Signed)
HPI:  61 year old gentleman presenting for followup evaluation. The patient has coronary artery disease. He initially presented in 2009 with a non-ST elevation infarction. He was treated with multivessel stenting of the left circumflex and right coronary arteries. He returned in 2012 with chest pain and underwent repeat cardiac catheterization. His stents were patent and he had mild to moderate diffuse disease elsewhere. Medical therapy was recommended.  From a symptomatic perspective, the patient has been doing well. He ran out of his blood pressure medicines on Friday and has not taken them in 3 days now. When he has been on his medicines he reports good control of his blood pressure. He denies chest pain, edema, or palpitations. He does complain of chronic dyspnea but this is unchanged over some time. He's a former smoker and remains quit.  Outpatient Encounter Prescriptions as of 01/18/2012  Medication Sig Dispense Refill  . allopurinol (ZYLOPRIM) 100 MG tablet TAKE 1 TABLET DAILY  30 tablet  1  . amLODipine (NORVASC) 10 MG tablet Take 1 tablet (10 mg total) by mouth daily.  30 tablet  6  . aspirin 81 MG tablet Take 81 mg by mouth daily.        Marland Kitchen atorvastatin (LIPITOR) 40 MG tablet Take 40 mg by mouth daily.        . carvedilol (COREG) 25 MG tablet Take 1 tablet (25 mg total) by mouth 2 (two) times daily with a meal.  60 tablet  6  . CIALIS 10 MG tablet TAKE 1 TABLET BY MOUTH AS DIRECTED  12 tablet  2  . clopidogrel (PLAVIX) 75 MG tablet Take 75 mg by mouth daily.        . furosemide (LASIX) 20 MG tablet TAKE ONE TABLET ONCE DAILY FOR 3 DAYS THEN TAKE ONE TABLET DAILY AS NEEDED FOR SWELLING  30 tablet  3  . lisinopril (PRINIVIL,ZESTRIL) 40 MG tablet TAKE 1 TABLET BY MOUTH EVERY DAY  30 tablet  9  . nitroGLYCERIN (NITROSTAT) 0.4 MG SL tablet Place 0.4 mg under the tongue every 5 (five) minutes x 3 doses as needed. For shortness of breath      . pantoprazole (PROTONIX) 40 MG tablet TAKE 1 TABLET  (40 MG TOTAL) BY MOUTH DAILY AT 6 (SIX) AM.  30 tablet  1  . potassium chloride (MICRO-K) 10 MEQ CR capsule Take one capsule daily for 3 days; THEN take one capsule daily AS NEEDED for swelling; take with furosemide  30 capsule  3  . colchicine 0.6 MG tablet Take 0.6 mg by mouth daily.        Marland Kitchen DISCONTD: atorvastatin (LIPITOR) 40 MG tablet TAKE 1 TABLET (40 MG TOTAL) BY MOUTH DAILY.  30 tablet  9  . DISCONTD: carvedilol (COREG) 25 MG tablet TAKE 1 TABLET TWICE DAILY  180 tablet  2  . DISCONTD: clopidogrel (PLAVIX) 75 MG tablet TAKE 1 TABLET BY MOUTH DAILY  30 tablet  5  . DISCONTD: lisinopril (PRINIVIL,ZESTRIL) 40 MG tablet Take 40 mg by mouth daily.          No Known Allergies  Past Medical History  Diagnosis Date  . Coronary artery disease     s/p NSTEMI 10/2007, RCA and LCx PCI with drug-eluting stents; patent stents cath 04/2011  . Hypertension     Poorly controlled  . DJD (degenerative joint disease)   . Hypercholesterolemia   . Myocardial infarction   . Emphysema   . Angina   . COPD (chronic obstructive pulmonary  disease)   . Shortness of breath   . Pneumonia   . Gout     ROS: Negative except as per HPI  BP 170/100  Pulse 86  Ht 6\' 1"  (1.854 m)  Wt 83.371 kg (183 lb 12.8 oz)  BMI 24.25 kg/m2  PHYSICAL EXAM: Pt is alert and oriented, NAD HEENT: normal Neck: JVP - normal, carotids 2+= without bruits Lungs: CTA bilaterally CV: RRR without murmur or gallop Abd: soft, NT, Positive BS, no hepatomegaly Ext: no C/C/E, distal pulses intact and equal Skin: warm/dry no rash  EKG:  Sinus rhythm 86 beats per minute, PVCs, leftward axis, nonspecific T wave abnormality.  ASSESSMENT AND PLAN:

## 2012-01-18 NOTE — Assessment & Plan Note (Signed)
Blood pressure is uncontrolled. This is because he has been off of his medicines for a couple days. He understands the importance of remaining on his meds. He will continue with amlodipine, carvedilol, and lisinopril. He is going to the pharmacy from here to pick up his antihypertensive medications.

## 2012-01-18 NOTE — Patient Instructions (Addendum)
Your physician recommends that you return for a FASTING LIPID, LIVER and BMP in 6 MONTHS--nothing to eat or drink after midnight, lab opens at 8:30 (please do one week prior to your appointment)  Your physician wants you to follow-up in: 6 MONTHS.  You will receive a reminder letter in the mail two months in advance. If you don't receive a letter, please call our office to schedule the follow-up appointment.  Your physician recommends that you continue on your current medications as directed. Please refer to the Current Medication list given to you today.

## 2012-01-18 NOTE — Assessment & Plan Note (Signed)
Lipids and LFTs from January were reviewed. His LDL cholesterol is at goal. His LFTs are normal. He should continue on his same medications with atorvastatin 40 mg daily. Followup lipids should be done in about 6 months.

## 2012-01-18 NOTE — Assessment & Plan Note (Addendum)
The patient is stable without anginal symptoms. He should continue on his current medical program. I would recommend long-term dual antiplatelet therapy as long as he tolerates without bleeding problems.

## 2012-03-21 ENCOUNTER — Other Ambulatory Visit: Payer: Self-pay | Admitting: Nurse Practitioner

## 2012-04-17 ENCOUNTER — Other Ambulatory Visit: Payer: Self-pay | Admitting: Physician Assistant

## 2012-04-22 ENCOUNTER — Other Ambulatory Visit: Payer: Self-pay | Admitting: Nurse Practitioner

## 2012-04-25 ENCOUNTER — Other Ambulatory Visit: Payer: Self-pay | Admitting: *Deleted

## 2012-04-25 MED ORDER — ALLOPURINOL 100 MG PO TABS: 100.0000 mg | ORAL_TABLET | Freq: Every day | ORAL | Status: DC

## 2012-04-25 NOTE — Telephone Encounter (Signed)
Fax Received. Refill Completed. [name] (R.M.A)   

## 2012-05-18 ENCOUNTER — Other Ambulatory Visit: Payer: Self-pay | Admitting: Nurse Practitioner

## 2012-05-18 ENCOUNTER — Other Ambulatory Visit: Payer: Self-pay | Admitting: Physician Assistant

## 2012-05-18 MED ORDER — FUROSEMIDE 20 MG PO TABS: 20.0000 mg | ORAL_TABLET | Freq: Every day | ORAL | Status: DC

## 2012-05-18 NOTE — Telephone Encounter (Signed)
Fax Received. Refill Completed. Brandy Zuba Chowoe (R.M.A)   

## 2012-06-17 ENCOUNTER — Other Ambulatory Visit: Payer: Self-pay | Admitting: Cardiovascular Disease

## 2012-06-21 ENCOUNTER — Other Ambulatory Visit: Payer: Self-pay | Admitting: Cardiovascular Disease

## 2012-06-22 ENCOUNTER — Other Ambulatory Visit: Payer: Self-pay | Admitting: Cardiovascular Disease

## 2012-07-24 ENCOUNTER — Other Ambulatory Visit: Payer: Medicare Other

## 2012-08-01 ENCOUNTER — Ambulatory Visit: Payer: Medicare Other | Admitting: Cardiovascular Disease

## 2012-08-07 ENCOUNTER — Other Ambulatory Visit: Payer: Medicare Other

## 2012-08-16 ENCOUNTER — Ambulatory Visit: Payer: Medicare Other | Admitting: Cardiovascular Disease

## 2012-08-28 ENCOUNTER — Encounter: Payer: Self-pay | Admitting: Cardiovascular Disease

## 2012-09-22 ENCOUNTER — Other Ambulatory Visit: Payer: Self-pay | Admitting: Cardiovascular Disease

## 2012-10-06 ENCOUNTER — Ambulatory Visit: Payer: Medicare Other | Admitting: Cardiovascular Disease

## 2012-11-01 ENCOUNTER — Other Ambulatory Visit: Payer: Self-pay | Admitting: Cardiovascular Disease

## 2012-12-17 ENCOUNTER — Other Ambulatory Visit: Payer: Self-pay | Admitting: Cardiovascular Disease

## 2012-12-18 ENCOUNTER — Encounter: Payer: Self-pay | Admitting: *Deleted

## 2012-12-27 ENCOUNTER — Ambulatory Visit: Payer: Medicare Other | Admitting: Cardiovascular Disease

## 2013-01-31 ENCOUNTER — Ambulatory Visit (INDEPENDENT_AMBULATORY_CARE_PROVIDER_SITE_OTHER): Payer: 59 | Admitting: Cardiovascular Disease

## 2013-01-31 ENCOUNTER — Encounter: Payer: Self-pay | Admitting: Cardiovascular Disease

## 2013-01-31 VITALS — BP 142/92 | HR 69 | Ht 73.0 in | Wt 187.0 lb

## 2013-01-31 DIAGNOSIS — I2510 Atherosclerotic heart disease of native coronary artery without angina pectoris: Secondary | ICD-10-CM

## 2013-01-31 DIAGNOSIS — I10 Essential (primary) hypertension: Secondary | ICD-10-CM

## 2013-01-31 DIAGNOSIS — E785 Hyperlipidemia, unspecified: Secondary | ICD-10-CM

## 2013-01-31 LAB — CBC WITH DIFFERENTIAL/PLATELET
Basophils Absolute: 0 10*3/uL (ref 0.0–0.1)
Basophils Relative: 0.4 % (ref 0.0–3.0)
Eosinophils Absolute: 0.1 10*3/uL (ref 0.0–0.7)
Lymphocytes Relative: 36.5 % (ref 12.0–46.0)
MCHC: 31.7 g/dL (ref 30.0–36.0)
MCV: 84.6 fl (ref 78.0–100.0)
Monocytes Absolute: 0.7 10*3/uL (ref 0.1–1.0)
Neutrophils Relative %: 51.1 % (ref 43.0–77.0)
Platelets: 328 10*3/uL (ref 150.0–400.0)
RBC: 4.96 Mil/uL (ref 4.22–5.81)
RDW: 15.6 % — ABNORMAL HIGH (ref 11.5–14.6)

## 2013-01-31 LAB — BASIC METABOLIC PANEL
BUN: 11 mg/dL (ref 6–23)
CO2: 26 mEq/L (ref 19–32)
Calcium: 9.3 mg/dL (ref 8.4–10.5)
Chloride: 101 mEq/L (ref 96–112)
Creatinine, Ser: 0.9 mg/dL (ref 0.4–1.5)
Glucose, Bld: 92 mg/dL (ref 70–99)

## 2013-01-31 LAB — HEPATIC FUNCTION PANEL
AST: 13 U/L (ref 0–37)
Albumin: 4.2 g/dL (ref 3.5–5.2)
Alkaline Phosphatase: 63 U/L (ref 39–117)
Bilirubin, Direct: 0 mg/dL (ref 0.0–0.3)
Total Bilirubin: 0.2 mg/dL — ABNORMAL LOW (ref 0.3–1.2)

## 2013-01-31 LAB — LIPID PANEL
Cholesterol: 204 mg/dL — ABNORMAL HIGH (ref 0–200)
Total CHOL/HDL Ratio: 3
VLDL: 31.4 mg/dL (ref 0.0–40.0)

## 2013-01-31 MED ORDER — ATORVASTATIN CALCIUM 40 MG PO TABS: ORAL_TABLET | ORAL | Status: DC

## 2013-01-31 NOTE — Patient Instructions (Addendum)
Your physician wants you to follow-up in: 1 YEAR with Dr Excell Seltzer.  You will receive a reminder letter in the mail two months in advance. If you don't receive a letter, please call our office to schedule the follow-up appointment.  Your physician recommends that you have lab work today: LIPID, LIVER, BMP and CBC  Your physician recommends that you continue on your current medications as directed. Please refer to the Current Medication list given to you today.

## 2013-01-31 NOTE — Progress Notes (Signed)
HPI:   62 year old gentleman presenting for followup evaluation. The patient has coronary artery disease. He initially presented in 2009 with a non-ST elevation infarction. He was treated with multivessel stenting of the left circumflex and right coronary arteries. He returned in 2012 with chest pain and underwent repeat cardiac catheterization. His stents were patent and he had mild to moderate diffuse disease elsewhere. Medical therapy was recommended.  The patient is doing fairly well. He denies chest pain or shortness of breath. He denies edema, orthopnea, or PND. He has not been very active because of low back pain. He has no other complaints today.   Outpatient Encounter Prescriptions as of 01/31/2013  Medication Sig Dispense Refill  . allopurinol (ZYLOPRIM) 100 MG tablet TAKE 1 TABLET DAILY  30 tablet  6  . amLODipine (NORVASC) 10 MG tablet Take 1 tablet (10 mg total) by mouth daily.  30 tablet  6  . aspirin 81 MG tablet Take 81 mg by mouth daily.        Marland Kitchen atorvastatin (LIPITOR) 40 MG tablet TAKE 1 TABLET (40 MG TOTAL) BY MOUTH DAILY.  30 tablet  7  . carvedilol (COREG) 25 MG tablet Take 1 tablet (25 mg total) by mouth 2 (two) times daily with a meal.  60 tablet  6  . CIALIS 10 MG tablet TAKE 1 TABLET BY MOUTH AS DIRECTED  12 tablet  1  . clopidogrel (PLAVIX) 75 MG tablet Take 75 mg by mouth daily.        . furosemide (LASIX) 20 MG tablet Take 1 tablet (20 mg total) by mouth daily.  30 tablet  3  . lisinopril (PRINIVIL,ZESTRIL) 40 MG tablet TAKE 1 TABLET BY MOUTH EVERY DAY  30 tablet  2  . nitroGLYCERIN (NITROSTAT) 0.4 MG SL tablet Place 0.4 mg under the tongue every 5 (five) minutes x 3 doses as needed. For shortness of breath      . pantoprazole (PROTONIX) 40 MG tablet TAKE 1 TABLET (40 MG TOTAL) BY MOUTH DAILY AT 6 (SIX) AM.  30 tablet  6  . potassium chloride (MICRO-K) 10 MEQ CR capsule Take one capsule daily for 3 days; THEN take one capsule daily AS NEEDED for swelling; take with  furosemide  30 capsule  3  . [DISCONTINUED] amLODipine (NORVASC) 5 MG tablet TAKE 1 TABLET DAILY  30 tablet  6  . [DISCONTINUED] CIALIS 10 MG tablet TAKE 1 TABLET BY MOUTH AS DIRECTED  12 tablet  1  . [DISCONTINUED] clopidogrel (PLAVIX) 75 MG tablet TAKE 1 TABLET BY MOUTH DAILY  30 tablet  6  . [DISCONTINUED] furosemide (LASIX) 20 MG tablet TAKE 1 TABLET BY MOUTH ONCE DAILY X 3 DAYS, THEN 1 TABLET DAILY FOR SWELLING  30 tablet  3  . colchicine 0.6 MG tablet Take 0.6 mg by mouth daily.         No facility-administered encounter medications on file as of 01/31/2013.    No Known Allergies  Past Medical History  Diagnosis Date  . Coronary artery disease     s/p NSTEMI 10/2007, RCA and LCx PCI with drug-eluting stents; patent stents cath 04/2011  . Hypertension     Poorly controlled  . DJD (degenerative joint disease)   . Hypercholesterolemia   . Myocardial infarction   . Emphysema   . Angina   . COPD (chronic obstructive pulmonary disease)   . Shortness of breath   . Pneumonia   . Gout     ROS: Negative except  as per HPI  BP 142/92  Pulse 69  Ht 6\' 1"  (1.854 m)  Wt 187 lb (84.823 kg)  BMI 24.68 kg/m2  PHYSICAL EXAM: Pt is alert and oriented, NAD HEENT: normal Neck: JVP - normal, carotids 2+= without bruits Lungs: CTA bilaterally CV: RRR without murmur or gallop Abd: soft, NT, Positive BS, no hepatomegaly Ext: no C/C/E, distal pulses intact and equal Skin: warm/dry no rash  EKG:  Normal sinus rhythm 69 beats per minute, nonspecific T wave abnormality.  ASSESSMENT AND PLAN: 1. Coronary artery disease, native vessel. The patient is stable without anginal symptoms. I have favored long-term dual antiplatelet therapy since he has undergone extensive stenting. He is at low bleeding risk. He will continue on aspirin and Plavix. Other medications were reviewed and no changes were recommended.  2. Essential hypertension. The patient is on maximal doses of amlodipine, carvedilol,  and lisinopril. Will continue the same. Encourage regular exercise and sodium restriction.  3. Hyperlipidemia. He is on atorvastatin 40 mg. Will repeat lipids and LFTs today since these labs are over due.  For followup I will see him back in 12 months.  Tonny Bollman 01/31/2013 11:46 AM

## 2013-02-02 ENCOUNTER — Encounter: Payer: Self-pay | Admitting: Cardiovascular Disease

## 2013-02-24 ENCOUNTER — Other Ambulatory Visit: Payer: Self-pay | Admitting: Cardiovascular Disease

## 2013-03-05 ENCOUNTER — Other Ambulatory Visit: Payer: Self-pay | Admitting: *Deleted

## 2013-03-05 MED ORDER — TADALAFIL 10 MG PO TABS: 10.0000 mg | ORAL_TABLET | Freq: Every day | ORAL | Status: DC | PRN

## 2013-03-06 ENCOUNTER — Other Ambulatory Visit: Payer: Self-pay

## 2013-03-06 DIAGNOSIS — E785 Hyperlipidemia, unspecified: Secondary | ICD-10-CM

## 2013-03-06 DIAGNOSIS — I10 Essential (primary) hypertension: Secondary | ICD-10-CM

## 2013-03-06 DIAGNOSIS — I2510 Atherosclerotic heart disease of native coronary artery without angina pectoris: Secondary | ICD-10-CM

## 2013-03-06 MED ORDER — PANTOPRAZOLE SODIUM 40 MG PO TBEC: DELAYED_RELEASE_TABLET | ORAL | Status: DC

## 2013-03-06 MED ORDER — AMLODIPINE BESYLATE 10 MG PO TABS: 10.0000 mg | ORAL_TABLET | Freq: Every day | ORAL | Status: DC

## 2013-03-06 MED ORDER — ALLOPURINOL 100 MG PO TABS: ORAL_TABLET | ORAL | Status: DC

## 2013-03-06 MED ORDER — CLOPIDOGREL BISULFATE 75 MG PO TABS: 75.0000 mg | ORAL_TABLET | Freq: Every day | ORAL | Status: DC

## 2013-03-06 MED ORDER — CARVEDILOL 25 MG PO TABS: ORAL_TABLET | ORAL | Status: DC

## 2013-03-06 MED ORDER — ATORVASTATIN CALCIUM 40 MG PO TABS: ORAL_TABLET | ORAL | Status: DC

## 2013-03-06 MED ORDER — LISINOPRIL 40 MG PO TABS: ORAL_TABLET | ORAL | Status: DC

## 2013-03-06 MED ORDER — FUROSEMIDE 20 MG PO TABS: 20.0000 mg | ORAL_TABLET | Freq: Every day | ORAL | Status: DC

## 2013-03-09 ENCOUNTER — Other Ambulatory Visit: Payer: Self-pay

## 2013-03-09 DIAGNOSIS — E785 Hyperlipidemia, unspecified: Secondary | ICD-10-CM

## 2013-03-09 DIAGNOSIS — I10 Essential (primary) hypertension: Secondary | ICD-10-CM

## 2013-03-09 DIAGNOSIS — I2510 Atherosclerotic heart disease of native coronary artery without angina pectoris: Secondary | ICD-10-CM

## 2013-03-09 MED ORDER — ALLOPURINOL 100 MG PO TABS: ORAL_TABLET | ORAL | Status: DC

## 2013-03-09 MED ORDER — PANTOPRAZOLE SODIUM 40 MG PO TBEC: DELAYED_RELEASE_TABLET | ORAL | Status: DC

## 2013-03-09 MED ORDER — LISINOPRIL 40 MG PO TABS: ORAL_TABLET | ORAL | Status: DC

## 2013-03-09 MED ORDER — ATORVASTATIN CALCIUM 40 MG PO TABS: ORAL_TABLET | ORAL | Status: DC

## 2013-03-09 MED ORDER — AMLODIPINE BESYLATE 10 MG PO TABS: 10.0000 mg | ORAL_TABLET | Freq: Every day | ORAL | Status: DC

## 2013-03-09 MED ORDER — CLOPIDOGREL BISULFATE 75 MG PO TABS: 75.0000 mg | ORAL_TABLET | Freq: Every day | ORAL | Status: DC

## 2013-03-09 MED ORDER — CARVEDILOL 25 MG PO TABS: ORAL_TABLET | ORAL | Status: DC

## 2013-03-15 ENCOUNTER — Other Ambulatory Visit: Payer: Self-pay

## 2013-03-15 ENCOUNTER — Telehealth: Payer: Self-pay | Admitting: Cardiovascular Disease

## 2013-03-15 MED ORDER — TADALAFIL 10 MG PO TABS: 10.0000 mg | ORAL_TABLET | Freq: Every day | ORAL | Status: DC | PRN

## 2013-03-15 MED ORDER — FUROSEMIDE 20 MG PO TABS: 20.0000 mg | ORAL_TABLET | Freq: Every day | ORAL | Status: DC

## 2013-03-15 MED ORDER — NITROGLYCERIN 0.4 MG SL SUBL: 0.4000 mg | SUBLINGUAL_TABLET | SUBLINGUAL | Status: DC | PRN

## 2013-03-15 NOTE — Telephone Encounter (Signed)
error 

## 2013-03-20 NOTE — Telephone Encounter (Signed)
PHARM CALLED TO VERIFY THAT THE MD KNEW THAT THE PATIENT WAS ON NITRO WITH CILIAS, I TOLD THEM THAT THE MD DOES PRESCRIBE NITRO TO PATIENT THAT ARE TAKING CILIAS

## 2013-05-24 ENCOUNTER — Telehealth: Payer: Self-pay | Admitting: *Deleted

## 2013-05-24 NOTE — Telephone Encounter (Signed)
PA for cialis.

## 2013-06-05 NOTE — Progress Notes (Signed)
Patient was approved for Cilias 04/24/13 until 05/7116 Case #[id number]

## 2013-08-10 ENCOUNTER — Other Ambulatory Visit: Payer: Self-pay | Admitting: Cardiovascular Disease

## 2013-08-10 NOTE — Telephone Encounter (Signed)
Ok to refill? Thanks, MI

## 2013-11-11 ENCOUNTER — Other Ambulatory Visit: Payer: Self-pay | Admitting: Cardiovascular Disease

## 2013-11-17 ENCOUNTER — Other Ambulatory Visit: Payer: Self-pay | Admitting: Cardiovascular Disease

## 2013-11-20 NOTE — Telephone Encounter (Signed)
Rx was sent to pharmacy electronically. 

## 2013-12-20 ENCOUNTER — Other Ambulatory Visit: Payer: Self-pay

## 2013-12-20 MED ORDER — TADALAFIL 10 MG PO TABS: ORAL_TABLET | ORAL | Status: DC

## 2014-02-06 ENCOUNTER — Ambulatory Visit: Payer: Medicare Other | Admitting: Nurse Practitioner

## 2014-02-28 ENCOUNTER — Ambulatory Visit: Payer: Medicare Other | Admitting: Nurse Practitioner

## 2014-04-24 ENCOUNTER — Encounter (HOSPITAL_COMMUNITY): Payer: Self-pay | Admitting: Cardiology

## 2014-05-21 ENCOUNTER — Observation Stay (HOSPITAL_COMMUNITY)
Admission: EM | Admit: 2014-05-21 | Discharge: 2014-05-23 | Disposition: A | Discharge status: Home / Self Care | Payer: Medicare Other | Attending: Internal Medicine | Admitting: Internal Medicine

## 2014-05-21 ENCOUNTER — Encounter (HOSPITAL_COMMUNITY): Payer: Self-pay

## 2014-05-21 ENCOUNTER — Emergency Department (HOSPITAL_COMMUNITY): Payer: Medicare Other

## 2014-05-21 DIAGNOSIS — R0602 Shortness of breath: Secondary | ICD-10-CM

## 2014-05-21 DIAGNOSIS — I10 Essential (primary) hypertension: Secondary | ICD-10-CM | POA: Diagnosis present

## 2014-05-21 DIAGNOSIS — Z87891 Personal history of nicotine dependence: Secondary | ICD-10-CM | POA: Insufficient documentation

## 2014-05-21 DIAGNOSIS — R55 Syncope and collapse: Principal | ICD-10-CM | POA: Diagnosis present

## 2014-05-21 DIAGNOSIS — J984 Other disorders of lung: Secondary | ICD-10-CM | POA: Diagnosis not present

## 2014-05-21 DIAGNOSIS — Z7902 Long term (current) use of antithrombotics/antiplatelets: Secondary | ICD-10-CM | POA: Insufficient documentation

## 2014-05-21 DIAGNOSIS — I2510 Atherosclerotic heart disease of native coronary artery without angina pectoris: Secondary | ICD-10-CM | POA: Diagnosis present

## 2014-05-21 DIAGNOSIS — R42 Dizziness and giddiness: Secondary | ICD-10-CM | POA: Insufficient documentation

## 2014-05-21 DIAGNOSIS — Z79899 Other long term (current) drug therapy: Secondary | ICD-10-CM | POA: Insufficient documentation

## 2014-05-21 DIAGNOSIS — E039 Hypothyroidism, unspecified: Secondary | ICD-10-CM | POA: Insufficient documentation

## 2014-05-21 DIAGNOSIS — M109 Gout, unspecified: Secondary | ICD-10-CM | POA: Insufficient documentation

## 2014-05-21 DIAGNOSIS — E785 Hyperlipidemia, unspecified: Secondary | ICD-10-CM | POA: Diagnosis present

## 2014-05-21 DIAGNOSIS — E780 Pure hypercholesterolemia: Secondary | ICD-10-CM | POA: Insufficient documentation

## 2014-05-21 DIAGNOSIS — M1990 Unspecified osteoarthritis, unspecified site: Secondary | ICD-10-CM | POA: Insufficient documentation

## 2014-05-21 DIAGNOSIS — R61 Generalized hyperhidrosis: Secondary | ICD-10-CM | POA: Diagnosis not present

## 2014-05-21 DIAGNOSIS — Z7982 Long term (current) use of aspirin: Secondary | ICD-10-CM | POA: Insufficient documentation

## 2014-05-21 DIAGNOSIS — J449 Chronic obstructive pulmonary disease, unspecified: Secondary | ICD-10-CM | POA: Insufficient documentation

## 2014-05-21 DIAGNOSIS — R609 Edema, unspecified: Secondary | ICD-10-CM

## 2014-05-21 DIAGNOSIS — I252 Old myocardial infarction: Secondary | ICD-10-CM | POA: Insufficient documentation

## 2014-05-21 DIAGNOSIS — R7989 Other specified abnormal findings of blood chemistry: Secondary | ICD-10-CM | POA: Diagnosis present

## 2014-05-21 LAB — CBC
HCT: 43.8 % (ref 39.0–52.0)
HEMOGLOBIN: 14.7 g/dL (ref 13.0–17.0)
MCH: 27.2 pg (ref 26.0–34.0)
MCHC: 33.6 g/dL (ref 30.0–36.0)
MCV: 81 fL (ref 78.0–100.0)
PLATELETS: 256 10*3/uL (ref 150–400)
RBC: 5.41 MIL/uL (ref 4.22–5.81)
RDW: 15.2 % (ref 11.5–15.5)
WBC: 6 10*3/uL (ref 4.0–10.5)

## 2014-05-21 LAB — BASIC METABOLIC PANEL
Anion gap: 15 (ref 5–15)
BUN: 14 mg/dL (ref 6–23)
CALCIUM: 9.3 mg/dL (ref 8.4–10.5)
CHLORIDE: 95 meq/L — AB (ref 96–112)
CO2: 24 mmol/L (ref 19–32)
Creatinine, Ser: 1.33 mg/dL (ref 0.50–1.35)
GFR, EST AFRICAN AMERICAN: 65 mL/min — AB (ref >90)
GFR, EST NON AFRICAN AMERICAN: 56 mL/min — AB (ref >90)
Glucose, Bld: 148 mg/dL — ABNORMAL HIGH (ref 70–99)
Potassium: 3.4 mmol/L — ABNORMAL LOW (ref 3.5–5.1)
Sodium: 134 mmol/L — ABNORMAL LOW (ref 135–145)

## 2014-05-21 LAB — I-STAT TROPONIN, ED: TROPONIN I, POC: 0.01 ng/mL (ref 0.00–0.08)

## 2014-05-21 LAB — TROPONIN I: Troponin I: <0.03 ng/mL (ref <0.031)

## 2014-05-21 MED ORDER — NITROGLYCERIN 0.4 MG SL SUBL: 0.4000 mg | SUBLINGUAL_TABLET | SUBLINGUAL | Status: DC | PRN

## 2014-05-21 MED ORDER — ATORVASTATIN CALCIUM 40 MG PO TABS
40.0000 mg | ORAL_TABLET | Freq: Every day | ORAL | Status: DC
  Administered 2014-05-21 – 2014-05-23 (×3): 40 mg via ORAL
  Filled 2014-05-21 (×3): qty 1

## 2014-05-21 MED ORDER — VITAMIN B-1 100 MG PO TABS
100.0000 mg | ORAL_TABLET | Freq: Every day | ORAL | Status: DC
  Administered 2014-05-22 – 2014-05-23 (×2): 100 mg via ORAL
  Filled 2014-05-21 (×2): qty 1

## 2014-05-21 MED ORDER — ACETAMINOPHEN 650 MG RE SUPP: 650.0000 mg | Freq: Four times a day (QID) | RECTAL | Status: DC | PRN

## 2014-05-21 MED ORDER — HEPARIN SODIUM (PORCINE) 5000 UNIT/ML IJ SOLN
5000.0000 [IU] | Freq: Three times a day (TID) | INTRAMUSCULAR | Status: DC
  Administered 2014-05-21 – 2014-05-23 (×6): 5000 [IU] via SUBCUTANEOUS
  Filled 2014-05-21 (×6): qty 1

## 2014-05-21 MED ORDER — ZOLPIDEM TARTRATE 5 MG PO TABS: 5.0000 mg | ORAL_TABLET | Freq: Every evening | ORAL | Status: DC | PRN

## 2014-05-21 MED ORDER — ASPIRIN EC 81 MG PO TBEC
81.0000 mg | DELAYED_RELEASE_TABLET | Freq: Every day | ORAL | Status: DC
  Administered 2014-05-21 – 2014-05-23 (×3): 81 mg via ORAL
  Filled 2014-05-21 (×3): qty 1

## 2014-05-21 MED ORDER — ONDANSETRON HCL 4 MG PO TABS: 4.0000 mg | ORAL_TABLET | Freq: Four times a day (QID) | ORAL | Status: DC | PRN

## 2014-05-21 MED ORDER — ACETAMINOPHEN 325 MG PO TABS
650.0000 mg | ORAL_TABLET | Freq: Four times a day (QID) | ORAL | Status: DC | PRN
  Administered 2014-05-21: 650 mg via ORAL
  Filled 2014-05-21: qty 2

## 2014-05-21 MED ORDER — CLOPIDOGREL BISULFATE 75 MG PO TABS
75.0000 mg | ORAL_TABLET | Freq: Every day | ORAL | Status: DC
  Administered 2014-05-21 – 2014-05-23 (×3): 75 mg via ORAL
  Filled 2014-05-21 (×3): qty 1

## 2014-05-21 MED ORDER — ADULT MULTIVITAMIN W/MINERALS CH
1.0000 | ORAL_TABLET | Freq: Every day | ORAL | Status: DC
  Administered 2014-05-22 – 2014-05-23 (×2): 1 via ORAL
  Filled 2014-05-21 (×2): qty 1

## 2014-05-21 MED ORDER — COLCHICINE 0.6 MG PO TABS
0.6000 mg | ORAL_TABLET | Freq: Every day | ORAL | Status: DC
  Administered 2014-05-22 – 2014-05-23 (×2): 0.6 mg via ORAL
  Filled 2014-05-21 (×2): qty 1

## 2014-05-21 MED ORDER — FUROSEMIDE 20 MG PO TABS
20.0000 mg | ORAL_TABLET | Freq: Every day | ORAL | Status: DC
  Administered 2014-05-22 – 2014-05-23 (×2): 20 mg via ORAL
  Filled 2014-05-21 (×2): qty 1

## 2014-05-21 MED ORDER — ALLOPURINOL 100 MG PO TABS
100.0000 mg | ORAL_TABLET | Freq: Every day | ORAL | Status: DC
  Administered 2014-05-22 – 2014-05-23 (×2): 100 mg via ORAL
  Filled 2014-05-21 (×2): qty 1

## 2014-05-21 MED ORDER — FOLIC ACID 1 MG PO TABS
1.0000 mg | ORAL_TABLET | Freq: Every day | ORAL | Status: DC
  Administered 2014-05-21 – 2014-05-23 (×3): 1 mg via ORAL
  Filled 2014-05-21 (×3): qty 1

## 2014-05-21 MED ORDER — SODIUM CHLORIDE 0.9 % IV SOLN
INTRAVENOUS | Status: DC
  Administered 2014-05-21: 23:00:00 via INTRAVENOUS

## 2014-05-21 MED ORDER — CARVEDILOL 12.5 MG PO TABS
25.0000 mg | ORAL_TABLET | Freq: Every day | ORAL | Status: DC
  Administered 2014-05-21 – 2014-05-23 (×3): 25 mg via ORAL
  Filled 2014-05-21 (×3): qty 2

## 2014-05-21 MED ORDER — AMLODIPINE BESYLATE 10 MG PO TABS
10.0000 mg | ORAL_TABLET | Freq: Every day | ORAL | Status: DC
  Administered 2014-05-22 – 2014-05-23 (×2): 10 mg via ORAL
  Filled 2014-05-21 (×2): qty 1

## 2014-05-21 MED ORDER — SODIUM CHLORIDE 0.9 % IJ SOLN
3.0000 mL | Freq: Two times a day (BID) | INTRAMUSCULAR | Status: DC
  Administered 2014-05-21 – 2014-05-22 (×3): 3 mL via INTRAVENOUS

## 2014-05-21 MED ORDER — ONDANSETRON HCL 4 MG/2ML IJ SOLN: 4.0000 mg | Freq: Four times a day (QID) | INTRAMUSCULAR | Status: DC | PRN

## 2014-05-21 MED ORDER — LISINOPRIL 20 MG PO TABS
40.0000 mg | ORAL_TABLET | Freq: Every day | ORAL | Status: DC
Start: 2014-05-22 — End: 2014-05-23
  Administered 2014-05-22 – 2014-05-23 (×2): 40 mg via ORAL
  Filled 2014-05-21 (×2): qty 2

## 2014-05-21 MED ORDER — POTASSIUM CHLORIDE CRYS ER 10 MEQ PO TBCR
30.0000 meq | EXTENDED_RELEASE_TABLET | Freq: Once | ORAL | Status: AC
  Administered 2014-05-21: 30 meq via ORAL
  Filled 2014-05-21 (×2): qty 1

## 2014-05-21 MED ORDER — PANTOPRAZOLE SODIUM 40 MG PO TBEC
40.0000 mg | DELAYED_RELEASE_TABLET | Freq: Every day | ORAL | Status: DC
  Administered 2014-05-21 – 2014-05-23 (×3): 40 mg via ORAL
  Filled 2014-05-21 (×3): qty 1

## 2014-05-21 NOTE — H&P (Signed)
[facility] History and Physical  Andrew AnaCharles W Graybeal ZOX:[id number] DOB: [birth year] DOA: 05/21/2014  Referring physician: Jaynie Crumbleatyana Kirichenko PA PCP: No primary care provider on file.   Chief Complaint: Syncope  HPI: [name] is a 64 y.o. male presents with syncope. Patient states that his wife though he passed out. She states that he was out for at least 3 minutes. Patient at the time had his eyes roll back and became unresponsive. She states he did not have any seizure. He does not use any drugs. He does not smoke. He did not have any recall for this episode. He was diaphoretic which is not new for him. He is not a diabetic. He has history of CAD. Has had stents placed for this. He state he has had stress and has not been eating correctly   Review of Systems:  Constitutional:  No weight loss, night sweats, Fevers, chills, fatigue.  HEENT:  No headaches, Difficulty swallowing Cardio-vascular:  No chest pain, Orthopnea, PND, swelling in lower extremities  GI:  No heartburn, indigestion, abdominal pain, nausea, vomiting, diarrhea  Resp:  No shortness of breath with exertion or at rest. No excess mucus, no productive cough No coughing up of blood.No wheezing. Skin:  no rash or lesions GU:  no dysuria, change in color of urine, no urgency or frequency.  Musculoskeletal:  No joint pain or swelling. No decreased range of motion.  Psych:  No change in mood or affect. No depression or anxiety.   Past Medical History  Diagnosis Date  . Coronary artery disease     s/p NSTEMI 10/2007, RCA and LCx PCI with drug-eluting stents; patent stents cath 04/2011  . Hypertension     Poorly controlled  . DJD (degenerative joint disease)   . Hypercholesterolemia   . Myocardial infarction   . Emphysema   . Angina   . COPD (chronic obstructive pulmonary disease)   . Shortness of breath   . Pneumonia   . Gout    Past Surgical History  Procedure Laterality Date  . Cervical disc  surgery    . Coronary angioplasty with stent placement    . Left heart catheterization with coronary angiogram N/A 04/20/2011    Procedure: LEFT HEART CATHETERIZATION WITH CORONARY ANGIOGRAM;  Surgeon: Peter M SwazilandJordan, MD;  [facility];  Service: Cardiovascular;  Laterality: N/A;   Social History:  reports that he quit smoking about 9 years ago. His smoking use included Cigarettes. He has a 30 pack-year smoking history. He does not have any smokeless tobacco history on file. He reports that he drinks about 1.5 oz of alcohol per week. He reports that he does not use illicit drugs.  No Known Allergies  Family History  Problem Relation Age of Onset  . Heart attack Mother [id number]    Deceased at 2845 from MI  . Pneumonia Father   . Hypertension Father   . Hypertension Sister   . Hypertension Sister      Prior to Admission medications   Medication Sig Start Date End Date Taking? Authorizing Provider  allopurinol (ZYLOPRIM) 100 MG tablet TAKE 1 TABLET DAILY   Yes Micheline ChapmanMichael D Cooper, MD  amLODipine (NORVASC) 10 MG tablet Take 1 tablet (10 mg total) by mouth daily. 03/09/13  Yes Micheline ChapmanMichael D Cooper, MD  aspirin 81 MG tablet Take 81 mg by mouth daily.     Yes Historical Provider, MD  atorvastatin (LIPITOR) 40 MG tablet TAKE 1 TABLET DAILY   Yes Veverly FellsMichael D  Excell Seltzer, MD  carvedilol (COREG) 25 MG tablet Take 25 mg by mouth daily.   Yes Historical Provider, MD  clopidogrel (PLAVIX) 75 MG tablet Take 1 tablet (75 mg total) by mouth daily. 03/09/13  Yes Micheline Chapman, MD  furosemide (LASIX) 20 MG tablet TAKE 1 TABLET DAILY   Yes Micheline Chapman, MD  lisinopril (PRINIVIL,ZESTRIL) 40 MG tablet TAKE 1 TABLET DAILY   Yes Micheline Chapman, MD  pantoprazole (PROTONIX) 40 MG tablet TAKE 1 TABLET (40 MG TOTAL) BY MOUTH DAILY AT 6 (SIX) AM. 03/09/13  Yes Micheline Chapman, MD  potassium chloride (MICRO-K) 10 MEQ CR capsule Take one capsule daily for 3 days; THEN take one capsule daily AS NEEDED for swelling; take  with furosemide 05/13/11  Yes Scott T Alben Spittle, PA-C  carvedilol (COREG) 25 MG tablet TAKE 1 TABLET TWICE A DAY    Micheline Chapman, MD  colchicine 0.6 MG tablet Take 0.6 mg by mouth daily.   04/23/11 04/22/12  Ok Anis, NP  nitroGLYCERIN (NITROSTAT) 0.4 MG SL tablet Place 1 tablet (0.4 mg total) under the tongue every 5 (five) minutes x 3 doses as needed. For shortness of breath 03/15/13   Micheline Chapman, MD  tadalafil (CIALIS) 10 MG tablet TAKE ONE TABLET DAILY AS NEEDED FOR ERECTILE DYSFUNCTION 12/20/13   Micheline Chapman, MD   Physical Exam: Filed Vitals:   05/21/14 1747 05/21/14 1749 05/21/14 1751 05/21/14 1800  BP: 157/89 167/83 162/93 150/91  Pulse: 71 71 79 67  Temp:      TempSrc:      Resp: Height:      Weight:      SpO2: 100% 98% 99% 98%    Wt Readings from Last 3 Encounters:  05/21/14 81.647 kg (180 lb)  01/31/13 84.823 kg (187 lb)  01/18/12 83.371 kg (183 lb 12.8 oz)    General:  Appears calm and comfortable Eyes: PERRL, normal lids, irises & conjunctiva ENT: grossly normal hearing, lips & tongue Neck: no LAD, masses or thyromegaly Cardiovascular: RRR, no m/r/g. No LE edema. Telemetry: SR, no arrhythmias  Respiratory: CTA bilaterally, no w/r/r. Normal respiratory effort. Abdomen: soft, ntnd Skin: no rash or induration seen on limited exam Musculoskeletal: grossly normal tone BUE/BLE Psychiatric: grossly normal mood and affect, speech fluent and appropriate Neurologic: grossly non-focal.          Labs on Admission:  Basic Metabolic Panel:  Recent Labs Lab 05/21/14 1708  NA 134*  K 3.4*  CL 95*  CO2 24  GLUCOSE 148*  BUN 14  CREATININE 1.33  CALCIUM 9.3   Liver Function Tests: No results for input(s): AST, ALT, ALKPHOS, BILITOT, PROT, ALBUMIN in the last 168 hours. No results for input(s): LIPASE, AMYLASE in the last 168 hours. No results for input(s): AMMONIA in the last 168 hours. CBC:  Recent Labs Lab 05/21/14 1708  WBC  6.0  HGB 14.7  HCT 43.8  MCV 81.0  PLT 256   Cardiac Enzymes: No results for input(s): CKTOTAL, CKMB, CKMBINDEX, TROPONINI in the last 168 hours.  BNP (last 3 results) No results for input(s): PROBNP in the last 8760 hours. CBG: No results for input(s): GLUCAP in the last 168 hours.  Radiological Exams on Admission: Dg Chest 2 View  05/21/2014   CLINICAL DATA:  Syncope  EXAM: CHEST  2 VIEW  COMPARISON:  04/22/2011  FINDINGS: Heart size and vascularity are normal. Negative for heart failure or pneumonia.  Right and left coronary artery stents noted.  Paucity of right apical lung markings may represent large bleb versus chronic loculated pneumothorax. This is unchanged. There multiple ill-defined densities in the area which could be due to prior pleurodesis or scarring. Left apical bleb also noted.  IMPRESSION: No active cardiopulmonary disease and no change from the prior study.   Electronically Signed   By: Marlan Palau M.D.   On: 05/21/2014 15:56     Assessment/Plan Active Problems:   HYPERTENSION, BENIGN   Syncope and collapse   1. Syncope and Collapse -will admit for observation -monitor on telemetry -will get carotid doppler in am  2. Hypertension -monitor pressures -will continue with home medications  3. Elevated Glucose -will monitor labs -check A1C    Code Status: Full Code (must indicate code status--if unknown or must be presumed, indicate so) DVT Prophylaxis:Heparin Family Communication: Wife (indicate person spoken with, if applicable, with phone number if by telephone) Disposition Plan: Home (indicate anticipated LOS)  Time spent:  [facility] Pager [phone]

## 2014-05-21 NOTE — ED Provider Notes (Signed)
CSN: [id number]     Arrival date & time 05/21/14  1420 History   First MD Initiated Contact with Patient 05/21/14 1654     Chief Complaint  Patient presents with  . Loss of Consciousness     (Consider location/radiation/quality/duration/timing/severity/associated sxs/prior Treatment) HPI [name] is a 64 y.o. male with hx of CAD, MI, COPD, htn, presents to ED complaining of a syncopal episode. Pt was sitting on the couch, watching tv, when he states he felt dizzy, hot, and became diaphoretic. He then lost conciousness. According to his wife "his eyes rolled to the back of the head, he fell back, and started snorring." He was not responding when she tried to shake him and talk to him. She called 911. Pt was unconscious for about 2-3 min. When came back he was not confused. He did have loss of bladder control. Pt denies any chest pain, no SOB, no headache or abdominal pain. He states he was diaphoretic until he got to the ER. Pt reports similar episode about 4-5 years ago, states no diagnosis at that time. Pt also admits to missing his medications for the last 3 days and taking them again today. He admits to stress, poor appetite.    Past Medical History  Diagnosis Date  . Coronary artery disease     s/p NSTEMI 10/2007, RCA and LCx PCI with drug-eluting stents; patent stents cath 04/2011  . Hypertension     Poorly controlled  . DJD (degenerative joint disease)   . Hypercholesterolemia   . Myocardial infarction   . Emphysema   . Angina   . COPD (chronic obstructive pulmonary disease)   . Shortness of breath   . Pneumonia   . Gout    Past Surgical History  Procedure Laterality Date  . Cervical disc surgery    . Coronary angioplasty with stent placement    . Left heart catheterization with coronary angiogram N/A 04/20/2011    Procedure: LEFT HEART CATHETERIZATION WITH CORONARY ANGIOGRAM;  Surgeon: Peter M SwazilandJordan, MD;  Location: [facility] CATH LAB;  Service: Cardiovascular;  Laterality:  N/A;   Family History  Problem Relation Age of Onset  . Heart attack Mother [id number]    Deceased at 2045 from MI  . Pneumonia Father   . Hypertension Father   . Hypertension Sister   . Hypertension Sister    History  Substance Use Topics  . Smoking status: Former Smoker -- 1.00 packs/day for 30 years    Types: Cigarettes    Quit date: 05/17/2005  . Smokeless tobacco: Not on file  . Alcohol Use: 1.5 oz/week    3 Not specified per week     Comment: occasionally    Review of Systems  Constitutional: Positive for diaphoresis and appetite change. Negative for fever and chills.  Respiratory: Negative for cough, chest tightness and shortness of breath.   Cardiovascular: Negative for chest pain, palpitations and leg swelling.  Gastrointestinal: Negative for nausea, vomiting, abdominal pain, diarrhea and abdominal distention.  Genitourinary: Negative for dysuria, urgency, frequency and hematuria.  Musculoskeletal: Negative for myalgias, arthralgias, neck pain and neck stiffness.  Skin: Negative for rash.  Allergic/Immunologic: Negative for immunocompromised state.  Neurological: Positive for dizziness, syncope, weakness and light-headedness. Negative for numbness and headaches.  All other systems reviewed and are negative.     Allergies  Review of patient's allergies indicates no known allergies.  Home Medications   Prior to Admission medications   Medication Sig Start Date End Date Taking? Authorizing  Provider  allopurinol (ZYLOPRIM) 100 MG tablet TAKE 1 TABLET DAILY   Yes Micheline Chapman, MD  amLODipine (NORVASC) 10 MG tablet Take 1 tablet (10 mg total) by mouth daily. 03/09/13  Yes Micheline Chapman, MD  aspirin 81 MG tablet Take 81 mg by mouth daily.     Yes Historical Provider, MD  atorvastatin (LIPITOR) 40 MG tablet TAKE 1 TABLET DAILY   Yes Micheline Chapman, MD  carvedilol (COREG) 25 MG tablet Take 25 mg by mouth daily.   Yes Historical Provider, MD  clopidogrel (PLAVIX) 75 MG  tablet Take 1 tablet (75 mg total) by mouth daily. 03/09/13  Yes Micheline Chapman, MD  furosemide (LASIX) 20 MG tablet TAKE 1 TABLET DAILY   Yes Micheline Chapman, MD  lisinopril (PRINIVIL,ZESTRIL) 40 MG tablet TAKE 1 TABLET DAILY   Yes Micheline Chapman, MD  pantoprazole (PROTONIX) 40 MG tablet TAKE 1 TABLET (40 MG TOTAL) BY MOUTH DAILY AT 6 (SIX) AM. 03/09/13  Yes Micheline Chapman, MD  potassium chloride (MICRO-K) 10 MEQ CR capsule Take one capsule daily for 3 days; THEN take one capsule daily AS NEEDED for swelling; take with furosemide 05/13/11  Yes Scott T Alben Spittle, PA-C  carvedilol (COREG) 25 MG tablet TAKE 1 TABLET TWICE A DAY    Micheline Chapman, MD  colchicine 0.6 MG tablet Take 0.6 mg by mouth daily.   04/23/11 04/22/12  Ok Anis, NP  nitroGLYCERIN (NITROSTAT) 0.4 MG SL tablet Place 1 tablet (0.4 mg total) under the tongue every 5 (five) minutes x 3 doses as needed. For shortness of breath 03/15/13   Micheline Chapman, MD  tadalafil (CIALIS) 10 MG tablet TAKE ONE TABLET DAILY AS NEEDED FOR ERECTILE DYSFUNCTION 12/20/13   Micheline Chapman, MD   BP 150/91 mmHg  Pulse 67  Temp(Src) 97.5 F (36.4 C) (Oral)  Resp 18  Ht  (1.854 m)  Wt 180 lb (81.647 kg)  BMI 23.75 kg/m2  SpO2 98% Physical Exam  Constitutional: He appears well-developed and well-nourished. No distress.  HENT:  Head: Normocephalic and atraumatic.  Eyes: Conjunctivae are normal.  Neck: Neck supple.  Cardiovascular: Normal rate, regular rhythm and normal heart sounds.   Pulmonary/Chest: Effort normal. No respiratory distress. He has no wheezes. He has no rales.  Abdominal: Soft. Bowel sounds are normal. He exhibits no distension. There is no tenderness. There is no rebound.  Musculoskeletal: He exhibits no edema.  Neurological: He is alert.  Skin: Skin is warm and dry.  Nursing note and vitals reviewed.   ED Course  Procedures (including critical care time) Labs Review Labs Reviewed  BASIC METABOLIC  PANEL - Abnormal; Notable for the following:    Sodium 134 (*)    Potassium 3.4 (*)    Chloride 95 (*)    Glucose, Bld 148 (*)    GFR calc non Af Amer 56 (*)    GFR calc Af Amer 65 (*)    All other components within normal limits  CBC  I-STAT TROPOININ, ED    Imaging Review Dg Chest 2 View  05/21/2014   CLINICAL DATA:  Syncope  EXAM: CHEST  2 VIEW  COMPARISON:  04/22/2011  FINDINGS: Heart size and vascularity are normal. Negative for heart failure or pneumonia. Right and left coronary artery stents noted.  Paucity of right apical lung markings may represent large bleb versus chronic loculated pneumothorax. This is unchanged. There multiple ill-defined densities in the area which could  be due to prior pleurodesis or scarring. Left apical bleb also noted.  IMPRESSION: No active cardiopulmonary disease and no change from the prior study.   Electronically Signed   By: Marlan Palau M.D.   On: 05/21/2014 15:56     EKG Interpretation   Date/Time:  Tuesday May 21 2014 17:42:43 EST Ventricular Rate:  68 PR Interval:  183 QRS Duration: 99 QT Interval:  446 QTC Calculation: 474 R Axis:   0 Text Interpretation:  Sinus rhythm Consider left atrial enlargement  Probable left ventricular hypertrophy Anterior ST elevation, probably due  to LVH Confirmed by ZAMMIT  MD, JOSEPH [id number]) on 05/21/2014 7:35:15 PM      MDM   Final diagnoses:  Syncope, unspecified syncope type    Pt with syncopal episode at home. Positive for loss of urine, no postictal state. Pt did have dizziness and diaphoresis prior. Hx of MI. Will get labs, CXR, orthostatics, monitor.   Pt's labs unremarkable. Pt is hypertensive, otherwise normal VS. Will get pt admitted for further workup for his syncope and for obs. Spoke with [facility]. Pt is asymptomatic, non toxic appearing.   Filed Vitals:   05/21/14 1747 05/21/14 1749 05/21/14 1751 05/21/14 1800  BP: 157/89 167/83 162/93 150/91  Pulse: 71 71 79 67  Temp:       TempSrc:      Resp: Height:      Weight:      SpO2: 100% 98% 99% 98%          Lottie Mussel, PA-C 05/21/14 1941  Benny Lennert, MD 05/24/14 1323

## 2014-05-21 NOTE — ED Notes (Signed)
Pt states he was sitting at home watching tv and became sweaty. Wife states he passed out for a few minutes. Takes plavix but has been out for about two weeks which takes for an MI 2 years ago.

## 2014-05-22 DIAGNOSIS — R7989 Other specified abnormal findings of blood chemistry: Secondary | ICD-10-CM | POA: Diagnosis present

## 2014-05-22 DIAGNOSIS — E785 Hyperlipidemia, unspecified: Secondary | ICD-10-CM | POA: Diagnosis not present

## 2014-05-22 DIAGNOSIS — R55 Syncope and collapse: Secondary | ICD-10-CM | POA: Diagnosis not present

## 2014-05-22 DIAGNOSIS — I379 Nonrheumatic pulmonary valve disorder, unspecified: Secondary | ICD-10-CM | POA: Diagnosis not present

## 2014-05-22 DIAGNOSIS — I10 Essential (primary) hypertension: Secondary | ICD-10-CM | POA: Diagnosis not present

## 2014-05-22 LAB — COMPREHENSIVE METABOLIC PANEL
ALBUMIN: 3.9 g/dL (ref 3.5–5.2)
ALT: 22 U/L (ref 0–53)
ANION GAP: 15 (ref 5–15)
AST: 30 U/L (ref 0–37)
Alkaline Phosphatase: 55 U/L (ref 39–117)
BILIRUBIN TOTAL: 0.9 mg/dL (ref 0.3–1.2)
BUN: 19 mg/dL (ref 6–23)
CO2: 20 mmol/L (ref 19–32)
Calcium: 8.9 mg/dL (ref 8.4–10.5)
Chloride: 102 mEq/L (ref 96–112)
Creatinine, Ser: 1.37 mg/dL — ABNORMAL HIGH (ref 0.50–1.35)
GFR calc Af Amer: 62 mL/min — ABNORMAL LOW (ref >90)
GFR calc non Af Amer: 54 mL/min — ABNORMAL LOW (ref >90)
GLUCOSE: 120 mg/dL — AB (ref 70–99)
POTASSIUM: 3.7 mmol/L (ref 3.5–5.1)
SODIUM: 137 mmol/L (ref 135–145)
Total Protein: 7.3 g/dL (ref 6.0–8.3)

## 2014-05-22 LAB — CBC
HCT: 39 % (ref 39.0–52.0)
Hemoglobin: 13.2 g/dL (ref 13.0–17.0)
MCH: 27.7 pg (ref 26.0–34.0)
MCHC: 33.8 g/dL (ref 30.0–36.0)
MCV: 81.8 fL (ref 78.0–100.0)
Platelets: 218 10*3/uL (ref 150–400)
RBC: 4.77 MIL/uL (ref 4.22–5.81)
RDW: 15.1 % (ref 11.5–15.5)
WBC: 5 10*3/uL (ref 4.0–10.5)

## 2014-05-22 LAB — TSH: TSH: 52.666 u[IU]/mL — AB (ref 0.350–4.500)

## 2014-05-22 LAB — T4, FREE: Free T4: 1.4 ng/dL (ref 0.80–1.80)

## 2014-05-22 LAB — GLUCOSE, CAPILLARY: Glucose-Capillary: 118 mg/dL — ABNORMAL HIGH (ref 70–99)

## 2014-05-22 LAB — TROPONIN I
Troponin I: <0.03 ng/mL (ref <0.031)
Troponin I: <0.03 ng/mL (ref <0.031)

## 2014-05-22 LAB — HEMOGLOBIN A1C
Hgb A1c MFr Bld: 6 % — ABNORMAL HIGH (ref <5.7)
Mean Plasma Glucose: 126 mg/dL — ABNORMAL HIGH (ref <117)

## 2014-05-22 MED ORDER — SODIUM CHLORIDE 0.9 % IJ SOLN: 3.0000 mL | INTRAMUSCULAR | Status: DC | PRN

## 2014-05-22 MED ORDER — SODIUM CHLORIDE 0.9 % IV SOLN: 250.0000 mL | INTRAVENOUS | Status: DC | PRN

## 2014-05-22 MED ORDER — SODIUM CHLORIDE 0.9 % IJ SOLN: 3.0000 mL | Freq: Two times a day (BID) | INTRAMUSCULAR | Status: DC

## 2014-05-22 NOTE — Progress Notes (Signed)
UR completed 

## 2014-05-22 NOTE — Progress Notes (Signed)
Subjective: No specific complaints.  Denies any dizziness or lightheadedness.  Reports that he will had an episode of bowel incontinence with a syncope.  He denies any preceding symptoms prior to syncope.  Objective: Vital signs in last 24 hours: Filed Vitals:   05/21/14 2137 05/22/14 0147 05/22/14 0630 05/22/14 1002  BP: 99/65 131/78 140/79 154/96  Pulse: 81 79 77 71  Temp: 98.3 F (36.8 C) 98 F (36.7 C) 97.8 F (36.6 C) 97.7 F (36.5 C)  TempSrc: Oral Oral Oral Oral  Resp: 18 18 18 18   Height:      Weight:      SpO2: 100% 97% 98% 99%   Weight change:  No intake or output data in the 24 hours ending 05/22/14 1141  Physical Exam: General: Awake, Oriented, No acute distress. HEENT: EOMI. Neck: Supple CV: S1 and S2 Lungs: Clear to ascultation bilaterally Abdomen: Soft, Nontender, Nondistended, +bowel sounds. Ext: Good pulses. Trace edema.  Lab Results: Basic Metabolic Panel:  Recent Labs Lab 05/21/14 1708 05/22/14 0350  NA 134* 137  K 3.4* 3.7  CL 95* 102  CO2 24 20  GLUCOSE 148* 120*  BUN 14 19  CREATININE 1.33 1.37*  CALCIUM 9.3 8.9   Liver Function Tests:  Recent Labs Lab 05/22/14 0350  AST 30  ALT 22  ALKPHOS 55  BILITOT 0.9  PROT 7.3  ALBUMIN 3.9   No results for input(s): LIPASE, AMYLASE in the last 168 hours. No results for input(s): AMMONIA in the last 168 hours. CBC:  Recent Labs Lab 05/21/14 1708 05/22/14 0350  WBC 6.0 5.0  HGB 14.7 13.2  HCT 43.8 39.0  MCV 81.0 81.8  PLT 256 218   Cardiac Enzymes:  Recent Labs Lab 05/21/14 2209 05/22/14 0350 05/22/14 0910  TROPONINI <0.03 <0.03 <0.03   BNP (last 3 results) No results for input(s): PROBNP in the last 8760 hours. CBG:  Recent Labs Lab 05/22/14 0628  GLUCAP 118*   No results for input(s): HGBA1C in the last 72 hours. Other Labs: Invalid input(s): POCBNP No results for input(s): DDIMER in the last 168 hours. No results for input(s): CHOL, HDL, LDLCALC, TRIG,  CHOLHDL, LDLDIRECT in the last 168 hours.  Recent Labs Lab 05/21/14 2205  TSH >90.000*   No results for input(s): VITAMINB12, FOLATE, FERRITIN, TIBC, IRON, RETICCTPCT in the last 168 hours.  Micro Results: No results found for this or any previous visit (from the past 240 hour(s)).  Studies/Results: Dg Chest 2 View  05/21/2014   CLINICAL DATA:  Syncope  EXAM: CHEST  2 VIEW  COMPARISON:  04/22/2011  FINDINGS: Heart size and vascularity are normal. Negative for heart failure or pneumonia. Right and left coronary artery stents noted.  Paucity of right apical lung markings may represent large bleb versus chronic loculated pneumothorax. This is unchanged. There multiple ill-defined densities in the area which could be due to prior pleurodesis or scarring. Left apical bleb also noted.  IMPRESSION: No active cardiopulmonary disease and no change from the prior study.   Electronically Signed   By: Marlan Palauharles  Clark M.D.   On: 05/21/2014 15:56    Medications: I have reviewed the patient's current medications. Scheduled Meds: . allopurinol  100 mg Oral Daily  . amLODipine  10 mg Oral Daily  . aspirin EC  81 mg Oral Daily  . atorvastatin  40 mg Oral Daily  . carvedilol  25 mg Oral Daily  . clopidogrel  75 mg Oral Daily  . colchicine  0.6  mg Oral Daily  . folic acid  1 mg Oral Daily  . furosemide  20 mg Oral Daily  . heparin  5,000 Units Subcutaneous 3 times per day  . lisinopril  40 mg Oral Daily  . multivitamin with minerals  1 tablet Oral Daily  . pantoprazole  40 mg Oral Daily  . sodium chloride  3 mL Intravenous Q12H  . thiamine  100 mg Oral Daily   Continuous Infusions: . sodium chloride 50 mL/hr at 05/21/14 2310   PRN Meds:.acetaminophen **OR** acetaminophen, nitroGLYCERIN, ondansetron **OR** ondansetron (ZOFRAN) IV, zolpidem  Assessment/Plan: Principal Problem:   Syncope and collapse Active Problems:   HYPERTENSION, BENIGN   CAD, NATIVE VESSEL   Syncope   Hyperlipemia    Abnormal TSH  Syncope and collapse Unclear etiology.  No events on telemetry, continue on telemetry.  Carotid Dopplers and 2-D echocardiogram requested.  Cardiac enzymes negative.  Patient's TSH is abnormal, suspect hypothyroidism may have contributed to his syncope.  However, recheck TSH to rule out lab error and send for free T4.  Patient has received adequate fluids, as a result cannot do orthostatics.  Patient reports that orthostatic vitals may have been done in the emergency department and reports that his blood pressure did not change significantly with different positions.  Abnormal TSH TSH greater than 90.  Resend TSH again to rule out lab error.  Free T4 pending.  If free T4 low, consider starting patient on Synthroid.  Patient indicates that he will arrange for primary care physician at the time of discharge for further monitoring.  Hypertension Stable.  Continue to monitor.  Continued on lisinopril, Lasix, and amlodipine  Abnormal glucose Hemoglobin A1c pending.  History of coronary artery disease Continue on carvedilol, and Plavix.  GERD Continue PPI.  Gout Continue colchicine and allopurinol.  Hyperlipidemia Stable.  Prophylaxis Subcutaneous heparin.  CODE STATUS Full code.  Disposition Continue as observation, discharge patient once TSH, free T4, carotid Dopplers, and 2-D echocardiogram are done and resulted.   LOS: 1 day  Essica Kiker A, MD 05/22/2014, 11:41 AM

## 2014-05-22 NOTE — Progress Notes (Signed)
  Echocardiogram 2D Echocardiogram has been performed.  Cathryne Mancebo FRANCES 05/22/2014, 12:55 PM

## 2014-05-22 NOTE — Progress Notes (Signed)
*  PRELIMINARY RESULTS* Vascular Ultrasound Carotid Duplex (Doppler) has been completed.  Findings suggest 1-39% internal carotid artery stenosis bilaterally. Vertebral arteries are patent with antegrade flow.  05/22/2014 4:52 PM Gertie FeyMichelle Kemyra August, RVT, RDCS, RDMS

## 2014-05-22 NOTE — Progress Notes (Signed)
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 Spouse stated that she will [facility] today to get the [facility] re-[facility] for primary care; Abelino DerrickB Bao Coreas RN,BSN,MHA [phone]

## 2014-05-22 NOTE — Progress Notes (Signed)
Pt arrived to 4N11. Oriented pt to room and equipment. VSS. Placed pt on tele. Will continue to monitor.

## 2014-05-23 ENCOUNTER — Other Ambulatory Visit: Payer: Self-pay | Admitting: Physician Assistant

## 2014-05-23 ENCOUNTER — Observation Stay (HOSPITAL_COMMUNITY): Payer: Medicare Other

## 2014-05-23 DIAGNOSIS — I2510 Atherosclerotic heart disease of native coronary artery without angina pectoris: Secondary | ICD-10-CM

## 2014-05-23 DIAGNOSIS — R55 Syncope and collapse: Secondary | ICD-10-CM | POA: Diagnosis not present

## 2014-05-23 DIAGNOSIS — I10 Essential (primary) hypertension: Secondary | ICD-10-CM | POA: Diagnosis not present

## 2014-05-23 DIAGNOSIS — R7989 Other specified abnormal findings of blood chemistry: Secondary | ICD-10-CM | POA: Diagnosis not present

## 2014-05-23 DIAGNOSIS — E785 Hyperlipidemia, unspecified: Secondary | ICD-10-CM | POA: Diagnosis not present

## 2014-05-23 LAB — BASIC METABOLIC PANEL
Anion gap: 5 (ref 5–15)
BUN: 19 mg/dL (ref 6–23)
CHLORIDE: 101 meq/L (ref 96–112)
CO2: 30 mmol/L (ref 19–32)
CREATININE: 1.04 mg/dL (ref 0.50–1.35)
Calcium: 9.2 mg/dL (ref 8.4–10.5)
GFR calc Af Amer: 87 mL/min — ABNORMAL LOW (ref >90)
GFR calc non Af Amer: 75 mL/min — ABNORMAL LOW (ref >90)
Glucose, Bld: 114 mg/dL — ABNORMAL HIGH (ref 70–99)
POTASSIUM: 3.4 mmol/L — AB (ref 3.5–5.1)
Sodium: 136 mmol/L (ref 135–145)

## 2014-05-23 LAB — CBC
HEMATOCRIT: 39.4 % (ref 39.0–52.0)
HEMOGLOBIN: 13.1 g/dL (ref 13.0–17.0)
MCH: 28.1 pg (ref 26.0–34.0)
MCHC: 33.2 g/dL (ref 30.0–36.0)
MCV: 84.5 fL (ref 78.0–100.0)
Platelets: 216 10*3/uL (ref 150–400)
RBC: 4.66 MIL/uL (ref 4.22–5.81)
RDW: 15.3 % (ref 11.5–15.5)
WBC: 4.5 10*3/uL (ref 4.0–10.5)

## 2014-05-23 LAB — GLUCOSE, CAPILLARY: Glucose-Capillary: 117 mg/dL — ABNORMAL HIGH (ref 70–99)

## 2014-05-23 MED ORDER — LEVOTHYROXINE SODIUM 25 MCG PO TABS: 25.0000 ug | ORAL_TABLET | Freq: Every day | ORAL | Status: AC

## 2014-05-23 MED ORDER — POTASSIUM CHLORIDE ER 10 MEQ PO CPCR: 10.0000 meq | ORAL_CAPSULE | Freq: Every day | ORAL | Status: DC

## 2014-05-23 MED ORDER — POTASSIUM CHLORIDE CRYS ER 20 MEQ PO TBCR
40.0000 meq | EXTENDED_RELEASE_TABLET | Freq: Once | ORAL | Status: AC
  Administered 2014-05-23: 40 meq via ORAL
  Filled 2014-05-23: qty 2

## 2014-05-23 MED ORDER — CARVEDILOL 6.25 MG PO TABS: 6.2500 mg | ORAL_TABLET | Freq: Two times a day (BID) | ORAL | Status: DC

## 2014-05-23 NOTE — Discharge Instructions (Signed)
Syncope °Syncope is a medical term for fainting or passing out. This means you lose consciousness and drop to the ground. People are generally unconscious for less than 5 minutes. You may have some muscle twitches for up to 15 seconds before waking up and returning to normal. Syncope occurs more often in older adults, but it can happen to anyone. While most causes of syncope are not dangerous, syncope can be a sign of a serious medical problem. It is important to seek medical care.  °CAUSES  °Syncope is caused by a sudden drop in blood flow to the brain. The specific cause is often not determined. Factors that can bring on syncope include: °· Taking medicines that lower blood pressure. °· Sudden changes in posture, such as standing up quickly. °· Taking more medicine than prescribed. °· Standing in one place for too long. °· Seizure disorders. °· Dehydration and excessive exposure to heat. °· Low blood sugar (hypoglycemia). °· Straining to have a bowel movement. °· Heart disease, irregular heartbeat, or other circulatory problems. °· Fear, emotional distress, seeing blood, or severe pain. °SYMPTOMS  °Right before fainting, you may: °· Feel dizzy or light-headed. °· Feel nauseous. °· See all white or all black in your field of vision. °· Have cold, clammy skin. °DIAGNOSIS  °Your health care provider will ask about your symptoms, perform a physical exam, and perform an electrocardiogram (ECG) to record the electrical activity of your heart. Your health care provider may also perform other heart or blood tests to determine the cause of your syncope which may include: °· Transthoracic echocardiogram (TTE). During echocardiography, sound waves are used to evaluate how blood flows through your heart. °· Transesophageal echocardiogram (TEE). °· Cardiac monitoring. This allows your health care provider to monitor your heart rate and rhythm in real time. °· Holter monitor. This is a portable device that records your  heartbeat and can help diagnose heart arrhythmias. It allows your health care provider to track your heart activity for several days, if needed. °· Stress tests by exercise or by giving medicine that makes the heart beat faster. °TREATMENT  °In most cases, no treatment is needed. Depending on the cause of your syncope, your health care provider may recommend changing or stopping some of your medicines. °HOME CARE INSTRUCTIONS °· Have someone stay with you until you feel stable. °· Do not drive, use machinery, or play sports until your health care provider says it is okay. °· Keep all follow-up appointments as directed by your health care provider. °· Lie down right away if you start feeling like you might faint. Breathe deeply and steadily. Wait until all the symptoms have passed. °· Drink enough fluids to keep your urine clear or pale yellow. °· If you are taking blood pressure or heart medicine, get up slowly and take several minutes to sit and then stand. This can reduce dizziness. °SEEK IMMEDIATE MEDICAL CARE IF:  °· You have a severe headache. °· You have unusual pain in the chest, abdomen, or back. °· You are bleeding from your mouth or rectum, or you have black or tarry stool. °· You have an irregular or very fast heartbeat. °· You have pain with breathing. °· You have repeated fainting or seizure-like jerking during an episode. °· You faint when sitting or lying down. °· You have confusion. °· You have trouble walking. °· You have severe weakness. °· You have vision problems. °If you fainted, call your local emergency services (911 in [place] Do not drive   yourself to the hospital.  °MAKE SURE YOU: °· Understand these instructions. °· Will watch your condition. °· Will get help right away if you are not doing well or get worse. °Document Released: 05/03/2005 Document Revised: 05/08/2013 Document Reviewed: 07/02/2011 °[employer]® Patient Information ©2015 [employer]. This information is not intended to replace  advice given to you by your health care provider. Make sure you discuss any questions you have with your health care provider. ° °

## 2014-05-23 NOTE — Progress Notes (Signed)
UR completed 

## 2014-05-23 NOTE — Progress Notes (Signed)
EEG completed, results pending. 

## 2014-05-23 NOTE — Consult Note (Signed)
Consult Reason for Consult:seizure vs syncope Referring Physician: Dr Waymon Amato  CC: I passed out  HPI: [name] is an 64 y.o. male presents with loss of consciousness. Patient states that his wife though he passed out. She states that he was out for at least 3 minutes. Patient at the time had his eyes roll back and became unresponsive. She notes his arms tensed up but he did not have any twitching of his extremities. She notes he had loss of urinary control. He did not have any recall for this episode. He was diaphoretic which is not new for him. Wife notes he had one prior episode a few years ago. No known seizure history. No history of stroke or head trauma.  No head imaging completed. Head CT from 2008 shows indeterminate focal hypodensity in the right frontal lobe.   Past Medical History  Diagnosis Date  . Coronary artery disease     s/p NSTEMI 10/2007, RCA and LCx PCI with drug-eluting stents; patent stents cath 04/2011  . Hypertension     Poorly controlled  . DJD (degenerative joint disease)   . Hypercholesterolemia   . Myocardial infarction   . Emphysema   . Angina   . COPD (chronic obstructive pulmonary disease)   . Shortness of breath   . Pneumonia   . Gout     Past Surgical History  Procedure Laterality Date  . Cervical disc surgery    . Coronary angioplasty with stent placement    . Left heart catheterization with coronary angiogram N/A 04/20/2011    Procedure: LEFT HEART CATHETERIZATION WITH CORONARY ANGIOGRAM;  Surgeon: Juergen Hardenbrook M [place], MD;  Location: [facility] CATH LAB;  Service: Cardiovascular;  Laterality: N/A;    Family History  Problem Relation Age of Onset  . Heart attack Mother 71    Deceased at 44 from MI  . Pneumonia Father   . Hypertension Father   . Hypertension Sister   . Hypertension Sister     Social History:  reports that he quit smoking about 9 years ago. His smoking use included Cigarettes. He has a 30 pack-year smoking history. He does not  have any smokeless tobacco history on file. He reports that he drinks about 1.5 oz of alcohol per week. He reports that he does not use illicit drugs.  No Known Allergies  Medications:  Scheduled: . allopurinol  100 mg Oral Daily  . amLODipine  10 mg Oral Daily  . aspirin EC  81 mg Oral Daily  . atorvastatin  40 mg Oral Daily  . carvedilol  25 mg Oral Daily  . clopidogrel  75 mg Oral Daily  . colchicine  0.6 mg Oral Daily  . folic acid  1 mg Oral Daily  . furosemide  20 mg Oral Daily  . heparin  5,000 Units Subcutaneous 3 times per day  . lisinopril  40 mg Oral Daily  . multivitamin with minerals  1 tablet Oral Daily  . pantoprazole  40 mg Oral Daily  . sodium chloride  3 mL Intravenous Q12H  . sodium chloride  3 mL Intravenous Q12H  . thiamine  100 mg Oral Daily    ROS: Out of a complete 14 system review, the patient complains of only the following symptoms, and all other reviewed systems are negative. +fatigue  Physical Examination: Filed Vitals:   05/23/14 1022  BP: 147/75  Pulse: 67  Temp: 98.2 F (36.8 C)  Resp: 20   Physical Exam  Constitutional: He appears  well-developed and well-nourished.  Psych: Affect appropriate to situation Eyes: No scleral injection HENT: No OP obstrucion Head: Normocephalic.  Cardiovascular: Normal rate and regular rhythm.  Respiratory: Effort normal and breath sounds normal.  GI: Soft. Bowel sounds are normal. No distension. There is no tenderness.  Skin: WDI  Neurologic Examination Mental Status: Alert, oriented, thought content appropriate.  Speech fluent without evidence of aphasia.  Able to follow 3 step commands without difficulty. Cranial Nerves: II: visual fields grossly normal, pupils equal, round, reactive to light and accommodation III,IV, VI: ptosis not present, extra-ocular motions intact bilaterally V,VII: smile symmetric, facial light touch sensation normal bilaterally VIII: hearing normal bilaterally IX,X:  gag reflex present XI: trapezius strength/neck flexion strength normal bilaterally XII: tongue strength normal  Motor: Right : Upper extremity    Left:     Upper extremity 5/5 deltoid       5/5 deltoid 5/5 biceps      5/5 biceps  5/5 triceps      5/5 triceps 5/5 hand grip      5/5 hand grip  Lower extremity     Lower extremity 5/5 hip flexor      5/5 hip flexor 5/5 quadricep      5/5 quadriceps  5/5 hamstrings     5/5 hamstrings 5/5 plantar flexion       5/5 plantar flexion 5/5 plantar extension     5/5 plantar extension Tone and bulk:normal tone throughout; no atrophy noted Sensory: Pinprick and light touch intact throughout, bilaterally Deep Tendon Reflexes: 2+ and symmetric throughout Plantars: Right: downgoing   Left: downgoing Cerebellar: normal finger-to-nose, normal rapid alternating movements and normal heel-to-shin test Gait: normal gait and station  Laboratory Studies:   Basic Metabolic Panel:  Recent Labs Lab 05/21/14 1708 05/22/14 0350 05/23/14 0615  NA 134* 137 136  K 3.4* 3.7 3.4*  CL 95* 102 101  CO2 24 20 30   GLUCOSE 148* 120* 114*  BUN 14 19 19   CREATININE 1.33 1.37* 1.04  CALCIUM 9.3 8.9 9.2    Liver Function Tests:  Recent Labs Lab 05/22/14 0350  AST 30  ALT 22  ALKPHOS 55  BILITOT 0.9  PROT 7.3  ALBUMIN 3.9   No results for input(s): LIPASE, AMYLASE in the last 168 hours. No results for input(s): AMMONIA in the last 168 hours.  CBC:  Recent Labs Lab 05/21/14 1708 05/22/14 0350 05/23/14 0615  WBC 6.0 5.0 4.5  HGB 14.7 13.2 13.1  HCT 43.8 39.0 39.4  MCV 81.0 81.8 84.5  PLT 256 218 216    Cardiac Enzymes:  Recent Labs Lab 05/21/14 2209 05/22/14 0350 05/22/14 0910  TROPONINI <0.03 <0.03 <0.03    BNP: Invalid input(s): POCBNP  CBG:  Recent Labs Lab 05/22/14 0628 05/23/14 0652  GLUCAP 118* 117*    Microbiology: Results for orders placed or performed during the hospital encounter of 08/02/09  Gram stain      Status: None   Collection Time: 08/02/09 12:00 PM  Result Value Ref Range Status   Specimen Description FLUID SYNOVIAL  Final   Special Requests NONE  Final   Gram Stain   Final    WBC PRESENT, PREDOMINANTLY PMN NO ORGANISMS SEEN SUBOPTIMAL SPECIMEN, RECEIVED IN EDTA TUBE   Report Status 08/02/2009 FINAL  Final  Cell count + diff,  w/ cryst-synvl fld     Status: Abnormal   Collection Time: 08/02/09 12:00 PM  Result Value Ref Range Status   Color, Synovial YELLOW (A) YELLOW  Final   Appearance-Synovial CLOUDY (A) CLEAR Final   Crystals, Fluid NO CRYSTALS SEEN  Final   WBC, Synovial (H) 0 - 200 /cu mm Corrected    [id number] CORRECTED ON 03/19 AT 1407: PREVIOUSLY REPORTED AS 33   Neutrophil, Synovial 75 (H) 0 - 25 % Final   Lymphocytes-Synovial Fld 0 0 - 20 % Final   Monocyte-Macrophage-Synovial Fluid 25 (L) 50 - 90 % Final   Eosinophils-Synovial 0 0 - 1 % Final    Coagulation Studies: No results for input(s): LABPROT, INR in the last 72 hours.  Urinalysis: No results for input(s): COLORURINE, LABSPEC, PHURINE, GLUCOSEU, HGBUR, BILIRUBINUR, KETONESUR, PROTEINUR, UROBILINOGEN, NITRITE, LEUKOCYTESUR in the last 168 hours.  Invalid input(s): APPERANCEUR  Lipid Panel:     Component Value Date/Time   CHOL 204* 01/31/2013 1149   TRIG 157.0* 01/31/2013 1149   HDL 76.00 01/31/2013 1149   CHOLHDL 3 01/31/2013 1149   VLDL 31.4 01/31/2013 1149   LDLCALC 52 05/20/2011 1154    HgbA1C:  Lab Results  Component Value Date   HGBA1C 6.0* 05/21/2014    Urine Drug Screen:  No results found for: LABOPIA, COCAINSCRNUR, LABBENZ, AMPHETMU, THCU, LABBARB  Alcohol Level: No results for input(s): ETH in the last 168 hours.   Imaging: Dg Chest 2 View  05/21/2014   CLINICAL DATA:  Syncope  EXAM: CHEST  2 VIEW  COMPARISON:  04/22/2011  FINDINGS: Heart size and vascularity are normal. Negative for heart failure or pneumonia. Right and left coronary artery stents noted.  Paucity of right apical lung  markings may represent large bleb versus chronic loculated pneumothorax. This is unchanged. There multiple ill-defined densities in the area which could be due to prior pleurodesis or scarring. Left apical bleb also noted.  IMPRESSION: No active cardiopulmonary disease and no change from the prior study.   Electronically Signed   By: Marlan Palau M.D.   On: 05/21/2014 15:56     Assessment/Plan:  63y/o gentleman presenting for evaluation of episode of loss of consciousness concerning for seizure vs syncope. 2D echo and carotid doppler both unremarkable. EEG prelim read is unremarkable. Unclear etiology of episode but based on duration would have concern for possible seizure episode.   Would hold on starting AED at this time. Suggest MRI brain, which can be completed as outpatient. After MRI suggest follow up with outpatient neurology. Patient is unable to drive, operate heavy machinery, perform activities at heights or participate in water activities until release by outpatient physician.    Elspeth Cho, DO [facility] [phone]  If 7pm- 7am, please page neurology on call as listed in [facility]ION. 05/23/2014, 12:54 PM

## 2014-05-23 NOTE — Procedures (Signed)
ELECTROENCEPHALOGRAM REPORT   Patient: [name]       Room #: 4n-11c EEG No. ID: [id number] Age: 64 y.o.        Sex: male Referring Physician:  Report Date:  05/23/2014        Interpreting Physician: Elspeth ChoSUMNER, Vickee Mormino, Jill AlexandersJUSTIN  History: [name] is an 64 y.o. male presents with loss of consciousness. Patient states that his wife though he passed out. She states that he was out for at least 3 minutes. Patient at the time had his eyes roll back and became unresponsive. She notes his arms tensed up but he did not have any twitching of his extremities. She notes he had loss of urinary control. He did not have any recall for this episode. He was diaphoretic which is not new for him. Wife notes he had one prior episode a few years ago. No known seizure history. No history of stroke or head trauma.  Medications:  Scheduled: . allopurinol  100 mg Oral Daily  . amLODipine  10 mg Oral Daily  . aspirin EC  81 mg Oral Daily  . atorvastatin  40 mg Oral Daily  . carvedilol  25 mg Oral Daily  . clopidogrel  75 mg Oral Daily  . colchicine  0.6 mg Oral Daily  . folic acid  1 mg Oral Daily  . furosemide  20 mg Oral Daily  . heparin  5,000 Units Subcutaneous 3 times per day  . lisinopril  40 mg Oral Daily  . multivitamin with minerals  1 tablet Oral Daily  . pantoprazole  40 mg Oral Daily  . sodium chloride  3 mL Intravenous Q12H  . sodium chloride  3 mL Intravenous Q12H  . thiamine  100 mg Oral Daily    Conditions of Recording:  This is a 16 channel EEG carried out with the patient in the drowsy state.  Description:  The waking background activity consists predominantly of a low voltage, symmetrical, theta activity with a poorly sustained  8-9 Hz alpha activity, seen from the parieto-occipital and posterior temporal regions.  Low voltage fast activity, poorly organized, is seen anteriorly and is at times superimposed on more posterior regions.  A mixture of theta and alpha rhythms are seen  from the central and temporal regions.  The patient drowses with slowing to irregular, low voltage theta and beta activity. Stage II sleep is not obtained.   Hyperventilation was performed and did not elicit any abnormalities. No photic response noted. Photic stimulation was not performed.   IMPRESSION: Normal drowsy EEG. A normal EEG does not rule out the possibility of an underlying seizure disorder.   Elspeth Choeter Maggi Hershkowitz, DO [facility] [phone]  If 7pm- 7am, please page neurology on call as listed in AMION. 05/23/2014, 6:20 PM

## 2014-05-23 NOTE — Consult Note (Signed)
CARDIOLOGY CONSULT NOTE   Patient ID: [name] MRN: [id number], DOB/AGE: [birth year]   Admit date: 05/21/2014 Date of Consult: 05/23/2014   Primary Physician: No primary care provider on file. Primary Cardiologist: Dr. Excell Seltzer  Pt. Profile  64 year old African-American male with history of HTN, HLD, COPD, hyperglycemia, and CAD presented with syncope  Problem List  Past Medical History  Diagnosis Date  . Coronary artery disease     s/p NSTEMI 10/2007, RCA and LCx PCI with drug-eluting stents; patent stents cath 04/2011  . Hypertension     Poorly controlled  . DJD (degenerative joint disease)   . Hypercholesterolemia   . Myocardial infarction   . Emphysema   . Angina   . COPD (chronic obstructive pulmonary disease)   . Shortness of breath   . Pneumonia   . Gout     Past Surgical History  Procedure Laterality Date  . Cervical disc surgery    . Coronary angioplasty with stent placement    . Left heart catheterization with coronary angiogram N/A 04/20/2011    Procedure: LEFT HEART CATHETERIZATION WITH CORONARY ANGIOGRAM;  Surgeon: Peter M Swaziland, MD;  Location: [facility] CATH LAB;  Service: Cardiovascular;  Laterality: N/A;     Allergies  No Known Allergies  HPI   [name] is a pleasant 64 year old African-American male with history of HTN, HLD, COPD, hyperglycemia, and CAD. He has been followed by Dr. Excell Seltzer. He initially had an NSTEMI in 10/2007 and received DES to RCA and left circumflex artery. His symptom back then was chest pressure. He had repeat cardiac catheterization in 2012 which showed EF 55-65%, 50-60% ostial left circumflex stenosis, 70% stenosis in OM1, 20-30% proximal RCA stenosis, 40% mid LAD stenosis, 50-60% ramus stenosis, previous stents patent. Her last follow-up with Dr. Excell Seltzer was in September 2014 at which time he was doing well. According to Dr. Excell Seltzer, given his history of extensive stenting, he favors long-term dual antiplatelet therapy as long as he  is at low risk for bleeding. He was on aspirin, Plavix, amlodipine, carvedilol, lisinopril, lasix and atorvastatin.  According to the patient, ever since his wife was laid off, they have been having financial issues. Since his previous insurance was through his wife, he has not has any insurance to cover for new office visits or medications. He states some of his medication has run out in the recent month. He has essentially stopped all medication for the past 2 weeks in an attempt to preserve whatever little medication he has left. He retook most of his medication Tuesday morning. Shortly afterward, he has some dizziness which is not unusual for him after taking medications. He attributed the dizziness to suddenly restarting all medication together. Later that morning, while sitting in the couch watching TV, he felt really hot and was diaphoretic. Shortly after, his wife noticed him became unresponsive and his eyes rolled back. The entire episode lasted at least 3 minutes. On arrival by EMS, he was noted to have bladder incontinence. He denies any history of seizure, however states he did have a syncopal episode 4-5 years ago in his grandson's birthday party. He states he was also very hot at the time and he was very diaphoretic. He denies any recent exertional chest discomfort, and described himself as fairly active at home. He denies any recent lower extremity edema, orthopnea or paroxysmal nocturnal dyspnea. Due to the recent stress, he has not been eating very well for the last 3 days.  [facility], his blood pressure was 150/91. Pulse 67. Afebrile. O2 saturation 98% on room air. He was admitted to the internal medicine service. Serial troponin overnight has been negative. Initial laboratory finding include sodium 134, potassium 3.4, creatinine 1.33. A1c 6.0. Initial TSH was markedly elevated at greater than 90. Repeat TSH and free T4 shows TSH 52, while free T4 normal at 1.40. EKG  shows normal sinus rhythm, LVH, mildly elevated J-point in anterior lead V2. Doppler ultrasound shows no significant carotid stenosis. Echocardiogram was obtained on 05/22/2014 which shows EF 60-65%, no regional wall motion abnormality on echocardiogram, normal diastolic function parameters. Neurology has been consulted who was concerned patient may had a seizure, EEG is currently pending. Cardiology has been consulted for syncope.   Inpatient Medications  . allopurinol  100 mg Oral Daily  . amLODipine  10 mg Oral Daily  . aspirin EC  81 mg Oral Daily  . atorvastatin  40 mg Oral Daily  . carvedilol  25 mg Oral Daily  . clopidogrel  75 mg Oral Daily  . colchicine  0.6 mg Oral Daily  . folic acid  1 mg Oral Daily  . furosemide  20 mg Oral Daily  . heparin  5,000 Units Subcutaneous 3 times per day  . lisinopril  40 mg Oral Daily  . multivitamin with minerals  1 tablet Oral Daily  . pantoprazole  40 mg Oral Daily  . sodium chloride  3 mL Intravenous Q12H  . sodium chloride  3 mL Intravenous Q12H  . thiamine  100 mg Oral Daily    Family History Family History  Problem Relation Age of Onset  . Heart attack Mother [id number]    Deceased at 3645 from MI  . Pneumonia Father   . Hypertension Father   . Hypertension Sister   . Hypertension Sister      Social History History   Social History  . Marital Status: Married    Spouse Name: N/A    Number of Children: 4  . Years of Education: N/A   Occupational History  . Disabled    Social History Main Topics  . Smoking status: Former Smoker -- 1.00 packs/day for 30 years    Types: Cigarettes    Quit date: 05/17/2005  . Smokeless tobacco: Not on file  . Alcohol Use: 1.5 oz/week    3 Not specified per week     Comment: occasionally  . Drug Use: No  . Sexual Activity: Yes   Other Topics Concern  . Not on file   Social History Narrative   Disabled veteran, [place] in [place] with his wife      Previous smoker - quit 2007        Previous Etoh and marijuana, quit 2009     Review of Systems  General:  No chills, fever, night sweats or weight changes.  Cardiovascular:  No chest pain, dyspnea on exertion, edema, orthopnea, palpitations, paroxysmal nocturnal dyspnea. Dermatological: No rash, lesions/masses Respiratory: No cough, dyspnea Urologic: No hematuria, dysuria. Loss of bladder control during syncope Abdominal:   No nausea, vomiting, diarrhea, bright red blood per rectum, melena, or hematemesis Neurologic:  No visual changes, wkns +diaphoresis, syncope, changes in mental status. All other systems reviewed and are otherwise negative except as noted above.  Physical Exam  Blood pressure 128/81, pulse 69, temperature 97.6 F (36.4 C), temperature source Oral, resp. rate 20, height 6\' 1"  (1.854 m), weight 180 lb (81.647 kg), SpO2 100 %.  General:  Pleasant, NAD Psych: Normal affect. Neuro: Alert and oriented X 3. Moves all extremities spontaneously. HEENT: Normal  Neck: Supple without bruits or JVD. Lungs:  Resp regular and unlabored, CTA. Heart: RRR no s3, s4, or murmurs. Abdomen: Soft, non-tender, non-distended, BS + x 4.  Extremities: No clubbing, cyanosis or edema. DP/PT/Radials 2+ and equal bilaterally.  Labs   Recent Labs  05/21/14 2209 05/22/14 0350 05/22/14 0910  TROPONINI <0.03 <0.03 <0.03   Lab Results  Component Value Date   WBC 4.5 05/23/2014   HGB 13.1 05/23/2014   HCT 39.4 05/23/2014   MCV 84.5 05/23/2014   PLT 216 05/23/2014    Recent Labs Lab 05/22/14 0350 05/23/14 0615  NA 137 136  K 3.7 3.4*  CL 102 101  CO2 20 30  BUN 19 19  CREATININE 1.37* 1.04  CALCIUM 8.9 9.2  PROT 7.3  --   BILITOT 0.9  --   ALKPHOS 55  --   ALT 22  --   AST 30  --   GLUCOSE 120* 114*   Lab Results  Component Value Date   CHOL 204* 01/31/2013   HDL 76.00 01/31/2013   LDLCALC 52 05/20/2011   TRIG 157.0* 01/31/2013   Lab Results  Component Value Date   DDIMER * 09/03/2008     0.49        AT THE INHOUSE ESTABLISHED CUTOFF VALUE OF 0.48 ug/mL FEU, THIS ASSAY HAS BEEN DOCUMENTED IN THE LITERATURE TO HAVE A SENSITIVITY AND NEGATIVE PREDICTIVE VALUE OF AT LEAST 98 TO 99%.  THE TEST RESULT SHOULD BE CORRELATED WITH AN ASSESSMENT OF THE CLINICAL PROBABILITY OF DVT / VTE.    Radiology/Studies  Dg Chest 2 View  05/21/2014   CLINICAL DATA:  Syncope  EXAM: CHEST  2 VIEW  COMPARISON:  04/22/2011  FINDINGS: Heart size and vascularity are normal. Negative for heart failure or pneumonia. Right and left coronary artery stents noted.  Paucity of right apical lung markings may represent large bleb versus chronic loculated pneumothorax. This is unchanged. There multiple ill-defined densities in the area which could be due to prior pleurodesis or scarring. Left apical bleb also noted.  IMPRESSION: No active cardiopulmonary disease and no change from the prior study.   Electronically Signed   By: Marlan Palau M.D.   On: 05/21/2014 15:56    ECG  NSR with LVH and elevated J point in V2, nonspecific TW changes  ASSESSMENT AND PLAN  1. Syncope  - neg serial enzyme, no significant ischemic EKG changes, no angina, unlikely to be ACS  - hypokalemic on arrival, frequent PVCs on telemetry, cannot r/o occurrence of ventricular arrhythmia  - possible etiology included vasovagal, hypothyroidism, ventricular arrhythmia, seizure or sudden drop in BP after missing medication for > 2 weeks  - may benefit from outpt 30 day event monitor  2. Elevated TSH with normal free T4  3. HTN 4. HLD 5. COPD 6. Hyperglycemia/prediabetes 7. CAD  - Echo 05/22/2014 EF 60-65%, no RWMA   Signed, Azalee Course, PA-C 05/23/2014, 3:58 PM  I have seen and examined the patient along with Azalee Course, PA-C.  I have reviewed the chart, notes and new data.  I agree with PA's note.  Key new complaints: feels well now Key examination changes: normal exam, no frank myxedema Key new findings / data: TSH elevation  is so severe that I think it signifies true hypothyroidism, notwithstanding the normal free T4  Low suspicion for acute coronary syndrome. Normal LVEF lessens  concern for ventricular tachyarrhythmia. Possible sudden marked bradycardia after taking a sudden large dose of betablocker in the setting of hypothyroidism.  Recommend: Reduce carvedilol to 6.25 mg BID 2 week event monitor (we will arrange) F/U with Dr. Excell Seltzer I believe he needs treatment for hypothyroidism, which should be gradually instituted in the setting of CAD. Would start levothyroxine at 25 mcg daily and titrate according to TSH.  Thurmon Fair, MD, [facility] [phone] 05/23/2014, 4:46 PM

## 2014-05-23 NOTE — Discharge Summary (Signed)
Physician Discharge Summary  [name] ZOX:[id number] DOB: Jun 13, 1950 DOA: 05/21/2014  PCP: No primary care provider on file.  Admit date: 05/21/2014 Discharge date: 05/23/2014  Time spent: Greater than 30 minutes  Recommendations for Outpatient Follow-up:  1. [facility], in 1 week with outpatient MRI head. 2. PCP of choice, in 1 week with repeat labs (BMP). Will also need follow-up of TSH in 4-6 weeks. 3. [facility]: MDs office will call and arrange outpatient heart monitor and follow-up with them. 4. Patient not to drive, operate heavy machinery, perform activities at heights or participate in water activities until release by outpatient physician.    Discharge Diagnoses:  Principal Problem:   Syncope and collapse Active Problems:   HYPERTENSION, BENIGN   CAD, NATIVE VESSEL   Syncope   Hyperlipemia   Abnormal TSH   Discharge Condition: Improved & Stable  Diet recommendation: Heart healthy diet.  Filed Weights   05/21/14 1445  Weight: 81.647 kg (180 lb)    History of present illness & Hospital course:  64 year old male with history of HTN, HLD, COPD, CAD, NSTEMI & PCI, gout, remote history of syncope (last episode 6 years ago, presented to the hospital with an episode of syncope. Patient apparently has been rationing his medications due to financial difficulties and lack of insurance. On the day of admission, he and his wife returned from outside and patient proceeded to sit down on his chair in his then. He had felt slightly dizzy while getting out of his car which was not unusual for him. His wife then heard a very loud snoring sound and rushed into the den and found the patient sitting back on his recliner, eyes rolled up, stiffening of his upper extremities, unresponsive but breathing. He was sweating profusely. According to her inpatient, the den was extremely hot and she opened a window. EMS was called and found that patient had urinary incontinence.  He he was unresponsive for about 3-4 minutes and then regained consciousness. He was hospitalized. EKG without acute changes. Telemetry shows sinus rhythm with occasional PVCs. 2-D echo and carotid Dopplers are both unremarkable. Differential diagnosis of his syncope: Orthostatic hypotension versus vasovagal versus seizures. Neurology and cardiology were both consulted. Neurology recommends outpatient MRI and follow-up with neurology but hold off on starting AED at this time. They have cleared him for discharge. Preliminary EEG read is unremarkable. Cardiology indicates that there is low suspicion for acute coronary syndrome. Normal LVEF lessons concern for ventricular tachyarrhythmia. They suspect possible sudden marked bradycardia after taking sudden large dose of carvedilol in the setting of hypothyroid. Cardiology recommends reducing carvedilol to 6.25 MG twice a day and will arrange for outpatient 2 week event monitor and follow up with cardiology. Patient's TSH is markedly elevated at 52.66 but free T4 is normal at 1.4. There is concern for hypothyroidism and patient will be started on levothyroxine 25 g daily and recommend follow-up of TSH in 4-6 weeks. All of this has been extensively discussed with patient and his spouse at bedside. Hypokalemia was replaced prior to discharge.     Consultations:  Neurology   Cardiology  Procedures:  EEG   Discharge Exam:  Complaints:  Patient denies any complaints. Denies dizziness, lightheadedness, chest pain or palpitations. No dyspnea   Filed Vitals:   05/22/14 2100 05/23/14 0138 05/23/14 1022 05/23/14 1437  BP: 140/82 127/75 147/75 128/81  Pulse: 65 69 67 69  Temp: 98.8 F (37.1 C) 97.9 F (36.6 C) 98.2 F (36.8  C) 97.6 F (36.4 C)  TempSrc: Oral Oral Oral Oral  Resp: Height:      Weight:      SpO2: 100% 100% 100% 100%    General exam: Pleasant middle-aged male sitting comfortably at edge of bed. Respiratory system:  Clear. No increased work of breathing. Cardiovascular system: S1 & S2 heard, RRR. No JVD, murmurs, gallops, clicks or pedal edema. Telemetry: Sinus rhythm with occasional PVCs.  Gastrointestinal system: Abdomen is nondistended, soft and nontender. Normal bowel sounds heard. Central nervous system: Alert and oriented. No focal neurological deficits. Extremities: Symmetric 5 x 5 power.  Discharge Instructions      Discharge Instructions    Call MD for:  persistant dizziness or light-headedness    Complete by:  As directed      Call MD for:    Complete by:  As directed   Passing out episodes.     Diet - low sodium heart healthy    Complete by:  As directed      Discharge instructions    Complete by:  As directed   Do NOT drive, operate heavy machinery, perform activities at heights or participate in water activities until release by outpatient physician.     Increase activity slowly    Complete by:  As directed             Medication List    STOP taking these medications        colchicine 0.6 MG tablet     tadalafil 10 MG tablet  Commonly known as:  CIALIS      TAKE these medications        allopurinol 100 MG tablet  Commonly known as:  ZYLOPRIM  TAKE 1 TABLET DAILY     amLODipine 10 MG tablet  Commonly known as:  NORVASC  Take 1 tablet (10 mg total) by mouth daily.     aspirin 81 MG tablet  Take 81 mg by mouth daily.     atorvastatin 40 MG tablet  Commonly known as:  LIPITOR  TAKE 1 TABLET DAILY     carvedilol 6.25 MG tablet  Commonly known as:  COREG  Take 1 tablet (6.25 mg total) by mouth 2 (two) times daily with a meal.     clopidogrel 75 MG tablet  Commonly known as:  PLAVIX  Take 1 tablet (75 mg total) by mouth daily.     furosemide 20 MG tablet  Commonly known as:  LASIX  TAKE 1 TABLET DAILY     levothyroxine 25 MCG tablet  Commonly known as:  LEVOTHROID  Take 1 tablet (25 mcg total) by mouth daily before breakfast.     lisinopril 40 MG tablet   Commonly known as:  PRINIVIL,ZESTRIL  TAKE 1 TABLET DAILY     nitroGLYCERIN 0.4 MG SL tablet  Commonly known as:  NITROSTAT  Place 1 tablet (0.4 mg total) under the tongue every 5 (five) minutes x 3 doses as needed. For shortness of breath     pantoprazole 40 MG tablet  Commonly known as:  PROTONIX  TAKE 1 TABLET (40 MG TOTAL) BY MOUTH DAILY AT 6 (SIX) AM.     potassium chloride 10 MEQ CR capsule  Commonly known as:  MICRO-K  Take 1 capsule (10 mEq total) by mouth daily.       Follow-up Information    Follow up with [facility]. Schedule an appointment as soon as possible for a visit  in 1 week.   Why:  To be evaluated with outpatient MRI head for passing out episode.   Contact information:   [address] [place] [address] [phone]      Follow up with Primary Medical Doctor, of choice. Schedule an appointment as soon as possible for a visit in 1 week.   Why:  To be seen with repeat labs (BMP). Will need repeat blood test (TSH) in 4-6 weeks.       The results of significant diagnostics from this hospitalization (including imaging, microbiology, ancillary and laboratory) are listed below for reference.    Significant Diagnostic Studies: Dg Chest 2 View  05/21/2014   CLINICAL DATA:  Syncope  EXAM: CHEST  2 VIEW  COMPARISON:  04/22/2011  FINDINGS: Heart size and vascularity are normal. Negative for heart failure or pneumonia. Right and left coronary artery stents noted.  Paucity of right apical lung markings may represent large bleb versus chronic loculated pneumothorax. This is unchanged. There multiple ill-defined densities in the area which could be due to prior pleurodesis or scarring. Left apical bleb also noted.  IMPRESSION: No active cardiopulmonary disease and no change from the prior study.   Electronically Signed   By: Marlan Palauharles  Clark M.D.   On: 05/21/2014 15:56    Microbiology: No results found for this or any previous visit (from  the past 240 hour(s)).   Labs: Basic Metabolic Panel:  Recent Labs Lab 05/21/14 1708 05/22/14 0350 05/23/14 0615  NA 134* 137 136  K 3.4* 3.7 3.4*  CL 95* 102 101  CO2 24 20 30   GLUCOSE 148* 120* 114*  BUN 14 19 19   CREATININE 1.33 1.37* 1.04  CALCIUM 9.3 8.9 9.2   Liver Function Tests:  Recent Labs Lab 05/22/14 0350  AST 30  ALT 22  ALKPHOS 55  BILITOT 0.9  PROT 7.3  ALBUMIN 3.9   No results for input(s): LIPASE, AMYLASE in the last 168 hours. No results for input(s): AMMONIA in the last 168 hours. CBC:  Recent Labs Lab 05/21/14 1708 05/22/14 0350 05/23/14 0615  WBC 6.0 5.0 4.5  HGB 14.7 13.2 13.1  HCT 43.8 39.0 39.4  MCV 81.0 81.8 84.5  PLT 256 218 216   Cardiac Enzymes:  Recent Labs Lab 05/21/14 2209 05/22/14 0350 05/22/14 0910  TROPONINI <0.03 <0.03 <0.03   BNP: BNP (last 3 results) No results for input(s): PROBNP in the last 8760 hours. CBG:  Recent Labs Lab 05/22/14 0628 05/23/14 0652  GLUCAP 118* 117*     Additional labs: 1. TSH: >90 & 52.66. Free T4: 1.4 2. Hemoglobin A1c: 6  3.  2-D echo: 05/22/14: Study Conclusions  - Left ventricle: The cavity size was normal. Systolic function was normal. The estimated ejection fraction was in the range of 60% to 65%. Wall motion was normal; there were no regional wall motion abnormalities. Left ventricular diastolic function parameters were normal. 4. Vascular Ultrasound Carotid Duplex (Doppler) has been completed.  Findings suggest 1-39% internal carotid artery stenosis bilaterally. Vertebral arteries are patent with antegrade flow   Signed:  Marcellus ScottHONGALGI,Lucah Petta, MD, FACP, FHM. [facility] Pager [phone]  If 7PM-7AM, please contact night-coverage [email or web address] Password [email or web address] 05/23/2014, 5:40 PM

## 2014-05-23 NOTE — Progress Notes (Signed)
I have sent staff email to cardiology office to contact patient to schedule 2 week event monitor and then followup with Dr. Alphonzo Grieveooper  Signed, Azalee CourseHao Khaiden Segreto PA Pager: [phone]

## 2014-05-23 NOTE — Progress Notes (Signed)
D/C orders received. Pt and spouse educated on d/c instructions and verbalized understanding. Pt handed d/c packet and prescriptions. IV and tele removed. Pt taken downstairs by staff via wheelchair.

## 2014-05-25 LAB — T3, FREE: T3, Free: 3.6 pg/mL (ref 2.0–4.4)

## 2014-06-18 ENCOUNTER — Telehealth: Payer: Self-pay | Admitting: Cardiovascular Disease

## 2014-06-18 NOTE — Telephone Encounter (Signed)
05-23-14  Received Note from Lupita LeashDonna to schedule patient for 2 week outpatient monitor per Azalee CourseHao Meng, PA after office message.  Left message on 05-24-14.  On 05-28-14   Spoke with patient- he is trying to have monitor put on by the [facility] doctor.  He will see him on 05-29-14.  He explain that he don't have the insurance  to cover the monitor. He will call me after he see the [facility] MD. 05-31-14  He spoke with Jacquelynn CreeAnthony  H and stated [facility] and will call [facility] if he wants a follow up.  Once patient has spoken with Ethelene Brownsnthony he should have sent a staff message to me to close the work WQ out and I would have notified the nurse.

## 2015-06-27 ENCOUNTER — Encounter: Payer: Self-pay | Admitting: Family Medicine

## 2015-06-27 ENCOUNTER — Ambulatory Visit (INDEPENDENT_AMBULATORY_CARE_PROVIDER_SITE_OTHER): Payer: Medicare Other | Admitting: Family Medicine

## 2015-06-27 VITALS — BP 178/86 | HR 90 | Temp 97.5°F | Wt 182.7 lb

## 2015-06-27 DIAGNOSIS — I10 Essential (primary) hypertension: Secondary | ICD-10-CM | POA: Diagnosis present

## 2015-06-27 DIAGNOSIS — M10032 Idiopathic gout, left wrist: Secondary | ICD-10-CM | POA: Diagnosis not present

## 2015-06-27 DIAGNOSIS — N529 Male erectile dysfunction, unspecified: Secondary | ICD-10-CM | POA: Diagnosis not present

## 2015-06-27 DIAGNOSIS — Z114 Encounter for screening for human immunodeficiency virus [HIV]: Secondary | ICD-10-CM

## 2015-06-27 DIAGNOSIS — M1A09X0 Idiopathic chronic gout, multiple sites, without tophus (tophi): Secondary | ICD-10-CM | POA: Diagnosis not present

## 2015-06-27 DIAGNOSIS — I2510 Atherosclerotic heart disease of native coronary artery without angina pectoris: Secondary | ICD-10-CM

## 2015-06-27 DIAGNOSIS — K219 Gastro-esophageal reflux disease without esophagitis: Secondary | ICD-10-CM | POA: Insufficient documentation

## 2015-06-27 DIAGNOSIS — R7989 Other specified abnormal findings of blood chemistry: Secondary | ICD-10-CM

## 2015-06-27 DIAGNOSIS — E785 Hyperlipidemia, unspecified: Secondary | ICD-10-CM | POA: Diagnosis not present

## 2015-06-27 DIAGNOSIS — Z1159 Encounter for screening for other viral diseases: Secondary | ICD-10-CM | POA: Diagnosis not present

## 2015-06-27 MED ORDER — ALLOPURINOL 100 MG PO TABS: 100.0000 mg | ORAL_TABLET | Freq: Every day | ORAL | Status: DC

## 2015-06-27 MED ORDER — PANTOPRAZOLE SODIUM 40 MG PO TBEC: DELAYED_RELEASE_TABLET | ORAL | Status: DC

## 2015-06-27 MED ORDER — NITROGLYCERIN 0.4 MG SL SUBL: 0.4000 mg | SUBLINGUAL_TABLET | SUBLINGUAL | Status: DC | PRN

## 2015-06-27 MED ORDER — COLCHICINE 0.6 MG PO TABS: 0.6000 mg | ORAL_TABLET | Freq: Every day | ORAL | Status: DC

## 2015-06-27 MED ORDER — LISINOPRIL 40 MG PO TABS: 40.0000 mg | ORAL_TABLET | Freq: Every day | ORAL | Status: DC

## 2015-06-27 MED ORDER — TADALAFIL 20 MG PO TABS: 10.0000 mg | ORAL_TABLET | ORAL | Status: DC | PRN

## 2015-06-27 MED ORDER — ATORVASTATIN CALCIUM 40 MG PO TABS: 40.0000 mg | ORAL_TABLET | Freq: Every day | ORAL | Status: DC

## 2015-06-27 MED ORDER — CLOPIDOGREL BISULFATE 75 MG PO TABS: 75.0000 mg | ORAL_TABLET | Freq: Every day | ORAL | Status: DC

## 2015-06-27 MED ORDER — AMLODIPINE BESYLATE 10 MG PO TABS: 10.0000 mg | ORAL_TABLET | Freq: Every day | ORAL | Status: DC

## 2015-06-27 MED ORDER — CARVEDILOL 6.25 MG PO TABS: 6.2500 mg | ORAL_TABLET | Freq: Two times a day (BID) | ORAL | Status: DC

## 2015-06-27 NOTE — Patient Instructions (Signed)
Thank you for coming in to clinic today.  1. Refilled all meds  2. For Gout - this is an acute flare, it is resolving, it will take time - Use ice packs, continue Ibuprofen as needed - Start Colchicine 0.6mg  daily - Do NOT take Allopurinol until 2 weeks after flare is resolved (no more pain), then start taking everyday with colchicine  3. BP elevated - Resume BP meds slowly, start Coreg  for twice daily for 3 to 5 days then add Amlodipine daily, then add Lisinopril over course of 1 to 2 weeks  4. Take Aspirin and plavix every day  Referral to Cardiologist, stay tuned for appointment  Offered Flu Shot, Tetanus Shot  - Please schedule a "Lab Only" visit (early morning, 8 to 9:00am) to get your blood work drawn here at our clinic. - You need to be fasting (No food or drink after midnight, and nothing in the morning before your blood draw). - I have already ordered your blood work (orders will be good for few months), so you may schedule this appointment at your convenience. - We can discuss results at next appointment, otherwise our office will contact you with results by phone or with a letter  Please schedule a follow-up appointment with Dr Althea Charon in 2 to 4 weeks to review Labs  If you have any other questions or concerns, please feel free to call the clinic to contact me. You may also schedule an earlier appointment if necessary.  However, if your symptoms get significantly worse, please go to the Emergency Department to seek immediate medical attention.  Saralyn Pilar, DO [facility]

## 2015-06-27 NOTE — Progress Notes (Signed)
Subjective:    Patient ID: [name], male    DOB: 24-Jun-1950, 65 y.o.   MRN: [id number]  [name] is a 65 y.o. male presenting on 06/27/2015 for New Patient (Initial Visit); Gout; and Medication Refill  Now has Medicaid / Medicare A&B, no longer followed by PCP at [facility], last visit 6 months ago out of all meds.  HPI  CAD, S/p stents / H/o NSTEMI: - Previously followed by Cardiology Dr Excell Seltzer (last office visit 01/2013), significant history with NSTEMI 2009 required multi-vessel stenting. Last cardiac cath 2012 (had CP) with patent stents, recommended continued med therapy at that time. Continued on ASA 81 and Plavix 75, discussion with recommendations to continue DAPT long-term. Since then, patient has not been able to follow-up with Cardiology since lost insurance and now has medicaid / medicare. Patient followed with PCP at the [place], they discontinued Plavix since "advised it should not be continued for >1 yr", despite patient requesting to stay on it. - Denies history of CHF or ischemic cardiomyopathy. Last ECHO 05/2014 - LVEF 60-65%. - Taking ASA  daily still (out of all other meds) - Denies active CP, SOB, lightheadedness, pre/syncope, edema, orthopnea  GOUT, ACUTE ON CHRONIC: - Reports chronic history of gout, prior flares usually in hands / wrists, sometimes knees, elbow. Rarely in feet or toes. Prior preventative treatment with Allopurinol  daily and Colchicine 0.6mg  daily, out for 6 months now. Last flare was >1 year ago. - Currently describes flare in Left Wrist, started about 1 week ago, was describing his wrist joint as being warm to touch, swollen, very painful to light touch. Tried Ibuprofen  x 4 daily for about 1 week, still taking with relief. Overall improved but still painful and swollen. Improved motion. - Denies fevers/chills, other joint pain or swelling  CHRONIC HTN: Reports - not checking BP at home. Current Meds - Lisinopril , Carvedilol   BID, Amlodipine   Reports out of all meds x 6 months. Previously tolerating well, w/o complaints.  PMH: - GERD: request refill on Protonix  - Hypothyroidism: previous lab tests with TSH >50 in hospital during acute illness 05/2014, started on levothyroxine daily, then never followed up with repeat TSH lab tests. Off levothyroxine >6 months.  - Erectile Dysfunction: Prescribed Cialis by Cardiology previously, requesting refill if can afford it   Past Medical History  Diagnosis Date  . Coronary artery disease     s/p NSTEMI 10/2007, RCA and LCx PCI with drug-eluting stents; patent stents cath 04/2011  . Hypertension     Poorly controlled  . DJD (degenerative joint disease)   . Hypercholesterolemia   . Myocardial infarction (HCC)   . Emphysema   . Angina   . COPD (chronic obstructive pulmonary disease) (HCC)   . Shortness of breath   . Pneumonia   . Gout    Social History   Social History  . Marital Status: Married    Spouse Name: N/A  . Number of Children: 4  . Years of Education: N/A   Occupational History  . Disabled    Social History Main Topics  . Smoking status: Former Smoker -- 1.00 packs/day for 30 years    Types: Cigarettes    Quit date: 05/17/2005  . Smokeless tobacco: Not on file  . Alcohol Use: 1.5 oz/week    3 Standard drinks or equivalent per week     Comment: occasionally  . Drug Use: No  . Sexual Activity: Yes  Other Topics Concern  . Not on file   Social History Narrative   Disabled veteran, lives in [place] with his wife      Previous smoker - quit 2007      Previous Etoh and marijuana, quit 2009   Family History  Problem Relation Age of Onset  . Heart attack Mother 60    Deceased at 33 from MI  . Pneumonia Father   . Hypertension Father   . Hypertension Sister   . Hypertension Sister    Current Outpatient Prescriptions on File Prior to Visit  Medication Sig  . aspirin 81 MG tablet Take 81 mg by mouth  daily.    Marland Kitchen levothyroxine (LEVOTHROID) 25 MCG tablet Take 1 tablet (25 mcg total) by mouth daily before breakfast.   No current facility-administered medications on file prior to visit.    Review of Systems Per HPI unless specifically indicated above     Objective:    BP 178/86 mmHg  Pulse 90  Temp(Src) 97.5 F (36.4 C) (Oral)  Wt 182 lb 11.2 oz (82.872 kg)  Wt Readings from Last 3 Encounters:  06/27/15 182 lb 11.2 oz (82.872 kg)  05/21/14 180 lb (81.647 kg)  01/31/13 187 lb (84.823 kg)    Physical Exam  Constitutional: He is oriented to person, place, and time. He appears well-developed and well-nourished. No distress.  Well-appearing, comfortable, cooperative  HENT:  Head: Normocephalic and atraumatic.  Mouth/Throat: Oropharynx is clear and moist.  Eyes: Conjunctivae are normal. Pupils are equal, round, and reactive to light.  Neck: Normal range of motion. Neck supple. No thyromegaly present.  Cardiovascular: Normal rate, regular rhythm, normal heart sounds and intact distal pulses.   No murmur heard. Pulmonary/Chest: Effort normal and breath sounds normal. No respiratory distress. He has no wheezes. He has no rales.  Musculoskeletal: He exhibits no edema or tenderness.  Lymphadenopathy:    He has no cervical adenopathy.  Neurological: He is alert and oriented to person, place, and time.  Skin: Skin is warm and dry. No rash noted. He is not diaphoretic.  Nursing note and vitals reviewed.      Assessment & Plan:   Problem List Items Addressed This Visit    Abnormal TSH    Prior elevated TSH >50 in 05/2014 during prior hospitalization for syncope, was on levo for short period then ran out x 6 months  Plan: 1. Ordered future re-check TSH 2. Hold refill levothyroxine for now with resuming all meds, concern for causing side effect of angina, wait until all meds resumed first and check TSH      Relevant Orders   TSH   CAD, NATIVE VESSEL    Stable without  angina. Only on ASA 81, as plavix DC'd by prior PCP, however rec to cont by cards - S/p NSTEMI 2009, stents  Plan: 1. Resume Plavix 75mg  daily given multivessel disease with stenting, per prior Cardiology rec for long-term DAPT. No inc bleeding risk. 2. Continue ASA 81mg  daily 3. Cont ACE, BB, Statin 4. Referral to re-establish with Cardiology, prefer to return to Dr Excell Seltzer, now has Medicare      Relevant Medications   nitroGLYCERIN (NITROSTAT) 0.4 MG SL tablet   lisinopril (PRINIVIL,ZESTRIL) 40 MG tablet   clopidogrel (PLAVIX) 75 MG tablet   carvedilol (COREG) 6.25 MG tablet   atorvastatin (LIPITOR) 40 MG tablet   amLODipine (NORVASC) 10 MG tablet   tadalafil (CIALIS) 20 MG tablet   Other Relevant Orders  Ambulatory referral to Cardiology   Erectile dysfunction    Resume Cialis, may not be able to afford now. Previously rx by Cardiology and approved./      Relevant Medications   tadalafil (CIALIS) 20 MG tablet   GERD (gastroesophageal reflux disease)    Refill protonix      Relevant Medications   pantoprazole (PROTONIX) 40 MG tablet   Gout    Acute on chronic gout flare of Left wrist, onset >7 days ago improvement, tried ibuprofen (out of chronic allopurinol and colchicine x 6 months, likely trigger). - Known chronic recurrent gout, had done well on prophylaxis  Plan: 1. Unlikely to reduce gout pain with meds at >7 weeks (not candidate for acute colchicine or prednisone) 2. Continue NSAIDs 3. Refill Colchicine 0.6mg  daily to start daily for prophylaxis 4. Refill Allopurinol  daily - Do NOT start until gout flare resolved x 2 weeks 5. Avoid triggers 6. Follow-up 1 mo       Relevant Medications   allopurinol (ZYLOPRIM) 100 MG tablet   colchicine 0.6 MG tablet   Hyperlipidemia    On Lipitor 40, off x 6 months. Tolerating well.  Plan: 1. Refill Lipitor 2. Check CMET, Fasting lipid panel      Relevant Medications   nitroGLYCERIN (NITROSTAT) 0.4 MG SL tablet    lisinopril (PRINIVIL,ZESTRIL) 40 MG tablet   carvedilol (COREG) 6.25 MG tablet   atorvastatin (LIPITOR) 40 MG tablet   amLODipine (NORVASC) 10 MG tablet   tadalafil (CIALIS) 20 MG tablet   Other Relevant Orders   Ambulatory referral to Cardiology   Lipid panel   COMPLETE METABOLIC PANEL WITH GFR   HYPERTENSION, BENIGN - Primary    Uncontrolled, off all anti-HTN meds (no refills since no PCP). Manual re-check still elevated SBP >170   Plan:  1. Refill all BP meds add back gradually - Coreg > Amlodipine > Lisinopril (each over few days) 2. Discontinue Lasix PRN (was for swelling, no history of CHF, last ECHO 1 yr ago), DC K PO 3. Check CMET for Cr, K 4. Lifestyle reduce salt, limited exercise 5. Check BP outside office 6. Follow-up 1 mo re-check BP       Relevant Medications   nitroGLYCERIN (NITROSTAT) 0.4 MG SL tablet   lisinopril (PRINIVIL,ZESTRIL) 40 MG tablet   carvedilol (COREG) 6.25 MG tablet   atorvastatin (LIPITOR) 40 MG tablet   amLODipine (NORVASC) 10 MG tablet   tadalafil (CIALIS) 20 MG tablet   Other Relevant Orders   Ambulatory referral to Cardiology   COMPLETE METABOLIC PANEL WITH GFR    Other Visit Diagnoses    Screening for HIV (human immunodeficiency virus)        Relevant Orders    HIV antibody    Need for hepatitis C screening test        Relevant Orders    Hepatitis C antibody       Meds ordered this encounter  Medications  . nitroGLYCERIN (NITROSTAT) 0.4 MG SL tablet    Sig: Place 1 tablet (0.4 mg total) under the tongue every 5 (five) minutes x 3 doses as needed. For shortness of breath    Dispense:  25 tablet    Refill:  3  . lisinopril (PRINIVIL,ZESTRIL) 40 MG tablet    Sig: Take 1 tablet (40 mg total) by mouth daily.    Dispense:  90 tablet    Refill:  3  . clopidogrel (PLAVIX) 75 MG tablet    Sig: Take 1 tablet (  75 mg total) by mouth daily.    Dispense:  90 tablet    Refill:  3  . carvedilol (COREG) 6.25 MG tablet    Sig: Take 1  tablet (6.25 mg total) by mouth 2 (two) times daily with a meal.    Dispense:  180 tablet    Refill:  3  . atorvastatin (LIPITOR) 40 MG tablet    Sig: Take 1 tablet (40 mg total) by mouth daily.    Dispense:  90 tablet    Refill:  3  . amLODipine (NORVASC) 10 MG tablet    Sig: Take 1 tablet (10 mg total) by mouth daily.    Dispense:  90 tablet    Refill:  3  . allopurinol (ZYLOPRIM) 100 MG tablet    Sig: Take 1 tablet (100 mg total) by mouth daily. Start taking 2 weeks after gout flare resolved.    Dispense:  90 tablet    Refill:  3  . colchicine 0.6 MG tablet    Sig: Take 1 tablet (0.6 mg total) by mouth daily.    Dispense:  90 tablet    Refill:  1  . tadalafil (CIALIS) 20 MG tablet    Sig: Take 0.5-1 tablets (10-20 mg total) by mouth every other day as needed for erectile dysfunction. Do NOT take Nitroglycerin with this med.    Dispense:  5 tablet    Refill:  11  . pantoprazole (PROTONIX) 40 MG tablet    Sig: TAKE 1 TABLET (40 MG TOTAL) BY MOUTH DAILY.    Dispense:  90 tablet    Refill:  3      Follow up plan: Return in about 4 weeks (around 07/25/2015) for blood pressure, lab results.  Saralyn Pilar, DO [facility], PGY-3

## 2015-06-27 NOTE — Assessment & Plan Note (Addendum)
Uncontrolled, off all anti-HTN meds (no refills since no PCP). Manual re-check still elevated SBP >170   Plan:  1. Refill all BP meds add back gradually - Coreg > Amlodipine > Lisinopril (each over few days) 2. Discontinue Lasix PRN (was for swelling, no history of CHF, last ECHO 1 yr ago), DC K PO 3. Check CMET for Cr, K 4. Lifestyle reduce salt, limited exercise 5. Check BP outside office 6. Follow-up 1 mo re-check BP

## 2015-06-28 DIAGNOSIS — N529 Male erectile dysfunction, unspecified: Secondary | ICD-10-CM | POA: Insufficient documentation

## 2015-06-28 NOTE — Assessment & Plan Note (Signed)
Resume Cialis, may not be able to afford now. Previously rx by Cardiology and approved./

## 2015-06-28 NOTE — Assessment & Plan Note (Signed)
Prior elevated TSH >50 in 05/2014 during prior hospitalization for syncope, was on levo for short period then ran out x 6 months  Plan: 1. Ordered future re-check TSH 2. Hold refill levothyroxine for now with resuming all meds, concern for causing side effect of angina, wait until all meds resumed first and check TSH

## 2015-06-28 NOTE — Assessment & Plan Note (Signed)
Acute on chronic gout flare of Left wrist, onset >7 days ago improvement, tried ibuprofen (out of chronic allopurinol and colchicine x 6 months, likely trigger). - Known chronic recurrent gout, had done well on prophylaxis  Plan: 1. Unlikely to reduce gout pain with meds at >7 weeks (not candidate for acute colchicine or prednisone) 2. Continue NSAIDs 3. Refill Colchicine 0.6mg  daily to start daily for prophylaxis 4. Refill Allopurinol  daily - Do NOT start until gout flare resolved x 2 weeks 5. Avoid triggers 6. Follow-up 1 mo

## 2015-06-28 NOTE — Assessment & Plan Note (Signed)
Stable without angina. Only on ASA 81, as plavix DC'd by prior PCP, however rec to cont by cards - S/p NSTEMI 2009, stents  Plan: 1. Resume Plavix  daily given multivessel disease with stenting, per prior Cardiology rec for long-term DAPT. No inc bleeding risk. 2. Continue ASA  daily 3. Cont ACE, BB, Statin 4. Referral to re-establish with Cardiology, prefer to return to Dr Excell Seltzer, now has Medicare

## 2015-06-28 NOTE — Assessment & Plan Note (Signed)
On Lipitor 40, off x 6 months. Tolerating well.  Plan: 1. Refill Lipitor 2. Check CMET, Fasting lipid panel

## 2015-06-28 NOTE — Assessment & Plan Note (Signed)
Refill protonix 

## 2015-07-03 ENCOUNTER — Encounter: Payer: Self-pay | Admitting: Cardiovascular Disease

## 2015-07-03 ENCOUNTER — Ambulatory Visit (INDEPENDENT_AMBULATORY_CARE_PROVIDER_SITE_OTHER): Payer: Medicare Other | Admitting: Cardiovascular Disease

## 2015-07-03 VITALS — BP 140/86 | HR 83 | Ht 73.0 in | Wt 183.2 lb

## 2015-07-03 DIAGNOSIS — I2510 Atherosclerotic heart disease of native coronary artery without angina pectoris: Secondary | ICD-10-CM | POA: Diagnosis not present

## 2015-07-03 DIAGNOSIS — I10 Essential (primary) hypertension: Secondary | ICD-10-CM

## 2015-07-03 NOTE — Progress Notes (Signed)
Cardiology Office Note Date:  07/03/2015   ID:  Mahmoud, Blazejewski [birth year], MRN [id number]  PCP:  Saralyn Pilar, DO  Cardiologist:  Tonny Bollman, MD    Chief Complaint  Patient presents with  . Follow-up  . Coronary Artery Disease   History of Present Illness: [name] is a 65 y.o. male who presents for followup evaluation. The patient has coronary artery disease. He initially presented in 2009 with a non-ST elevation infarction. He was treated with multivessel stenting of the left circumflex and right coronary arteries. He returned in 2012 with chest pain and underwent repeat cardiac catheterization. His stents were patent and he had mild to moderate diffuse disease elsewhere. Medical therapy was recommended.   the patient is doing well. He denies chest pain, chest pressure, or shortness of breath. He has some questions about continued need for Plavix. He's been seen at the [facility] since the time of his last visit here in 2014. He had run out of all his medicines and discussed back on antihypertensive medications last week.  Past Medical History  Diagnosis Date  . Coronary artery disease     s/p NSTEMI 10/2007, RCA and LCx PCI with drug-eluting stents; patent stents cath 04/2011  . Hypertension     Poorly controlled  . DJD (degenerative joint disease)   . Hypercholesterolemia   . Myocardial infarction (HCC)   . Emphysema   . Angina   . COPD (chronic obstructive pulmonary disease) (HCC)   . Shortness of breath   . Pneumonia   . Gout     Past Surgical History  Procedure Laterality Date  . Cervical disc surgery    . Coronary angioplasty with stent placement    . Left heart catheterization with coronary angiogram N/A 04/20/2011    Procedure: LEFT HEART CATHETERIZATION WITH CORONARY ANGIOGRAM;  Surgeon: Peter M Swaziland, MD;  Location: [facility] CATH LAB;  Service: Cardiovascular;  Laterality: N/A;    Current Outpatient Prescriptions  Medication Sig  Dispense Refill  . allopurinol (ZYLOPRIM) 100 MG tablet Take 1 tablet (100 mg total) by mouth daily. Start taking 2 weeks after gout flare resolved. 90 tablet 3  . amLODipine (NORVASC) 10 MG tablet Take 1 tablet (10 mg total) by mouth daily. 90 tablet 3  . aspirin 81 MG tablet Take 81 mg by mouth daily.      Marland Kitchen atorvastatin (LIPITOR) 40 MG tablet Take 1 tablet (40 mg total) by mouth daily. 90 tablet 3  . carvedilol (COREG) 6.25 MG tablet Take 1 tablet (6.25 mg total) by mouth 2 (two) times daily with a meal. 180 tablet 3  . clopidogrel (PLAVIX) 75 MG tablet Take 1 tablet (75 mg total) by mouth daily. 90 tablet 3  . colchicine 0.6 MG tablet Take 1 tablet (0.6 mg total) by mouth daily. 90 tablet 1  . levothyroxine (LEVOTHROID) 25 MCG tablet Take 1 tablet (25 mcg total) by mouth daily before breakfast. 30 tablet 0  . lisinopril (PRINIVIL,ZESTRIL) 40 MG tablet Take 1 tablet (40 mg total) by mouth daily. 90 tablet 3  . nitroGLYCERIN (NITROSTAT) 0.4 MG SL tablet Place 1 tablet (0.4 mg total) under the tongue every 5 (five) minutes x 3 doses as needed. For shortness of breath 25 tablet 3  . pantoprazole (PROTONIX) 40 MG tablet TAKE 1 TABLET (40 MG TOTAL) BY MOUTH DAILY. 90 tablet 3  . tadalafil (CIALIS) 20 MG tablet Take 0.5-1 tablets (10-20 mg total) by mouth every other day  as needed for erectile dysfunction. Do NOT take Nitroglycerin with this med. 5 tablet 11   No current facility-administered medications for this visit.    Allergies:   Review of patient's allergies indicates no known allergies.   Social History:  The patient  reports that he quit smoking about 10 years ago. His smoking use included Cigarettes. He has a 30 pack-year smoking history. He does not have any smokeless tobacco history on file. He reports that he drinks about 1.5 oz of alcohol per week. He reports that he does not use illicit drugs.   Family History:  The patient's  family history includes Heart attack (age of onset: 92)  in his mother; Hypertension in his father, sister, and sister; Pneumonia in his father.   ROS:  Please see the history of present illness.  All other systems are reviewed and negative.   PHYSICAL EXAM: VS:  BP 140/86 mmHg  Pulse 83  Ht  (1.854 m)  Wt 83.099 kg (183 lb 3.2 oz)  BMI 24.18 kg/m2  SpO2 93% , BMI Body mass index is 24.18 kg/(m^2). GEN: Well nourished, well developed, in no acute distress HEENT: normal Neck: no JVD, no masses. No carotid bruits Cardiac: RRR without murmur or gallop                Respiratory:  clear to auscultation bilaterally, normal work of breathing GI: soft, nontender, nondistended, + BS MS: no deformity or atrophy Ext: no pretibial edema, pedal pulses 2+= bilaterally Skin: warm and dry, no rash Neuro:  Strength and sensation are intact Psych: euthymic mood, full affect  EKG:  EKG is ordered today. The ekg ordered today shows  Normal sinus rhythm 80 bpm , LVH , nonspecific ST and T-wave abnormality.  Recent Labs: No results found for requested labs within last 365 days.   Lipid Panel     Component Value Date/Time   CHOL 204* 01/31/2013 1149   TRIG 157.0* 01/31/2013 1149   HDL 76.00 01/31/2013 1149   CHOLHDL 3 01/31/2013 1149   VLDL 31.4 01/31/2013 1149   LDLCALC 52 05/20/2011 1154   LDLDIRECT 125.6 01/31/2013 1149      Wt Readings from Last 3 Encounters:  07/03/15 83.099 kg (183 lb 3.2 oz)  06/27/15 82.872 kg (182 lb 11.2 oz)  05/21/14 81.647 kg (180 lb)     Cardiac Studies Reviewed: 2D Echo 05/22/2014: Study Conclusions  - Left ventricle: The cavity size was normal. Systolic function was normal. The estimated ejection fraction was in the range of 60% to 65%. Wall motion was normal; there were no regional wall motion abnormalities. Left ventricular diastolic function parameters were normal.   ASSESSMENT AND PLAN: 1.  CAD, native vessel:  The patient is stable without symptoms of angina. I reviewed his coronary  anatomy and he's been treated with multiple drug-eluting stents , especially in the right coronary artery where there are multiple overlapping stents which were deployed to treat a long segment of disease. Considering the fact that he's had no history of bleeding problems, I think it's reasonable to continue him on long-term dual antiplatelet therapy with aspirin and Plavix. I'll see him back in one year for follow-up.  2. Essential HTN: blood pressure is controlled on a combination of amlodipine , carvedilol , and lisinopril.  3. Hyperlipidemia:  Treated with atorvastatin 40 mg daily.  Current medicines are reviewed with the patient today.  The patient  concerns regarding medicines.  Labs/ tests ordered today include:  Orders Placed This Encounter  Procedures  . EKG 12-Lead    Disposition:   FU one year  Signed, Tonny Bollman, MD  07/03/2015 5:43 PM    [facility] [address] [place], [place], [place]  [address] Phone: [phone]; Fax: [phone]

## 2015-07-03 NOTE — Patient Instructions (Signed)

## 2015-07-28 ENCOUNTER — Ambulatory Visit (INDEPENDENT_AMBULATORY_CARE_PROVIDER_SITE_OTHER): Payer: Medicare Other | Admitting: *Deleted

## 2015-07-28 ENCOUNTER — Other Ambulatory Visit: Payer: Medicare Other

## 2015-07-28 VITALS — BP 140/72 | HR 82

## 2015-07-28 DIAGNOSIS — R7989 Other specified abnormal findings of blood chemistry: Secondary | ICD-10-CM | POA: Diagnosis not present

## 2015-07-28 DIAGNOSIS — I10 Essential (primary) hypertension: Secondary | ICD-10-CM

## 2015-07-28 DIAGNOSIS — Z1159 Encounter for screening for other viral diseases: Secondary | ICD-10-CM

## 2015-07-28 DIAGNOSIS — Z114 Encounter for screening for human immunodeficiency virus [HIV]: Secondary | ICD-10-CM | POA: Diagnosis not present

## 2015-07-28 DIAGNOSIS — Z136 Encounter for screening for cardiovascular disorders: Secondary | ICD-10-CM

## 2015-07-28 DIAGNOSIS — E785 Hyperlipidemia, unspecified: Secondary | ICD-10-CM

## 2015-07-28 DIAGNOSIS — R799 Abnormal finding of blood chemistry, unspecified: Secondary | ICD-10-CM | POA: Diagnosis not present

## 2015-07-28 DIAGNOSIS — Z0130 Encounter for examination of blood pressure without abnormal findings: Secondary | ICD-10-CM

## 2015-07-28 LAB — LIPID PANEL
CHOL/HDL RATIO: 2.3 ratio (ref <=5.0)
CHOLESTEROL: 123 mg/dL — AB (ref 125–200)
HDL: 53 mg/dL (ref >=40)
LDL Cholesterol: 42 mg/dL (ref <130)
TRIGLYCERIDES: 140 mg/dL (ref <150)
VLDL: 28 mg/dL (ref <30)

## 2015-07-28 LAB — COMPLETE METABOLIC PANEL WITH GFR
ALBUMIN: 4 g/dL (ref 3.6–5.1)
ALK PHOS: 64 U/L (ref 40–115)
ALT: 23 U/L (ref 9–46)
AST: 19 U/L (ref 10–35)
BILIRUBIN TOTAL: 0.5 mg/dL (ref 0.2–1.2)
BUN: 10 mg/dL (ref 7–25)
CALCIUM: 8.8 mg/dL (ref 8.6–10.3)
CO2: 25 mmol/L (ref 20–31)
CREATININE: 0.73 mg/dL (ref 0.70–1.25)
Chloride: 104 mmol/L (ref 98–110)
GLUCOSE: 100 mg/dL — AB (ref 65–99)
Potassium: 3.5 mmol/L (ref 3.5–5.3)
Sodium: 142 mmol/L (ref 135–146)
Total Protein: 7 g/dL (ref 6.1–8.1)

## 2015-07-28 LAB — TSH: TSH: 2.32 mIU/L (ref 0.40–4.50)

## 2015-07-28 NOTE — Progress Notes (Signed)
   Patient in nurse clinic for blood pressure check.  Patient denies any symptoms today.  Will forward to PCP.  Clovis PuMartin, Sharone Almond L, RN Today's Vitals   07/28/15 0825  BP: 140/72  Pulse: 82  SpO2: 95%  PainSc: 0-No pain

## 2015-07-28 NOTE — Progress Notes (Signed)
Labs done today [name] 

## 2015-07-29 LAB — HIV ANTIBODY (ROUTINE TESTING W REFLEX): HIV 1&2 Ab, 4th Generation: NONREACTIVE

## 2015-07-29 LAB — HEPATITIS C ANTIBODY: HCV AB: NEGATIVE

## 2015-07-30 ENCOUNTER — Telehealth: Payer: Self-pay | Admitting: Family Medicine

## 2015-07-30 NOTE — Telephone Encounter (Signed)
Last seen by me on 06/27/15 for annual physical, obtained future fasting labs 07/28/15, checked screening labs with CMET, fasting lipid panel, and routine HIV / Hep C screening, TSH. Results as follows:  1. CMET Normal electrolytes, K 3.5, normal Cr 0.73, normal LFTs, mildly elevated glucose at 100. History of Pre-DM with prior glucose 110s, last A1c 6.0 (05/2014)  2. Lipids - Normal. Good elevated HDL 53, good low LDL 42, normal TG and total chol. Currently on statin, atorvastatin 40mg  daily. Calculated ASCVD 10 yr risk at 9.8%, no med change.  3. HIV / Hep C Screening - Both negative.  4. TSH normal, controlled on Levothyroxine 25 mcg daily  Plan: - Given mildly elevated fasting glucose, would repeat screen for DM with A1c within 3-6 months, should check 1-2x yearly  Called patient, reviewed above lab results, follow-up plan to re-check A1c, he has followed up with Cardiology, BP is more controlled now, and Cards agreed to resume DAPT ASA / Plavix  Saralyn PilarAlexander Faylinn Schwenn, DO [facility], PGY-3

## 2015-09-11 ENCOUNTER — Telehealth: Payer: Self-pay | Admitting: Family Medicine

## 2015-09-11 DIAGNOSIS — R2689 Other abnormalities of gait and mobility: Secondary | ICD-10-CM

## 2015-09-11 DIAGNOSIS — R269 Unspecified abnormalities of gait and mobility: Secondary | ICD-10-CM

## 2015-09-11 NOTE — Telephone Encounter (Signed)
[facility].  Please call.

## 2015-09-11 NOTE — Telephone Encounter (Signed)
I last saw patient in 06/2015 to establish care with this patient, see office note dated 06/27/15 for full details, we had discussed a prior hospitalization in 05/2014 with possible seizure vs syncope, he had been evaluated by Neurology ([facility]) at that time, he had EEG done (unremarkable), and they recommended outpatient MRI and outpatient Neurology follow-up. Patient never followed up with Neurology at that time, and never obtained MRI brain.  I called him back, and spoke to his wife Rocco SereneCathy Brine, she states that he has had continued difficulty with ambulation and balance, among other concerns, and would like to follow-up with Neurology. They attempted to call to schedule from the old referral, but it had expired, now requesting repeat referral to [facility]. Ordered today 09/11/15.  Also, they are to drop off handicap placard form for completion.  Saralyn PilarAlexander Zikeria Keough, DO [facility], PGY-3

## 2015-09-30 ENCOUNTER — Encounter: Payer: Self-pay | Admitting: Neurology

## 2015-09-30 ENCOUNTER — Ambulatory Visit (INDEPENDENT_AMBULATORY_CARE_PROVIDER_SITE_OTHER): Payer: Medicare Other | Admitting: Neurology

## 2015-09-30 ENCOUNTER — Telehealth: Payer: Self-pay | Admitting: Family Medicine

## 2015-09-30 VITALS — BP 112/72 | HR 86 | Resp 20 | Ht 73.0 in | Wt 180.0 lb

## 2015-09-30 DIAGNOSIS — R29898 Other symptoms and signs involving the musculoskeletal system: Secondary | ICD-10-CM | POA: Insufficient documentation

## 2015-09-30 DIAGNOSIS — W19XXXA Unspecified fall, initial encounter: Secondary | ICD-10-CM | POA: Insufficient documentation

## 2015-09-30 DIAGNOSIS — R269 Unspecified abnormalities of gait and mobility: Secondary | ICD-10-CM | POA: Diagnosis not present

## 2015-09-30 DIAGNOSIS — I2510 Atherosclerotic heart disease of native coronary artery without angina pectoris: Secondary | ICD-10-CM | POA: Diagnosis not present

## 2015-09-30 DIAGNOSIS — R258 Other abnormal involuntary movements: Secondary | ICD-10-CM

## 2015-09-30 DIAGNOSIS — G9589 Other specified diseases of spinal cord: Secondary | ICD-10-CM | POA: Diagnosis not present

## 2015-09-30 DIAGNOSIS — M4806 Spinal stenosis, lumbar region: Secondary | ICD-10-CM

## 2015-09-30 DIAGNOSIS — M5417 Radiculopathy, lumbosacral region: Secondary | ICD-10-CM | POA: Diagnosis not present

## 2015-09-30 DIAGNOSIS — R252 Cramp and spasm: Secondary | ICD-10-CM | POA: Insufficient documentation

## 2015-09-30 DIAGNOSIS — M48061 Spinal stenosis, lumbar region without neurogenic claudication: Secondary | ICD-10-CM

## 2015-09-30 MED ORDER — TRAMADOL HCL 50 MG PO TABS: ORAL_TABLET | ORAL | Status: DC

## 2015-09-30 NOTE — Telephone Encounter (Signed)
Form in MD box 

## 2015-09-30 NOTE — Telephone Encounter (Signed)
Pt brought in forms to be completed for disability placard.  He will pick it up when complete

## 2015-09-30 NOTE — Patient Instructions (Signed)
Remember to drink plenty of fluid, eat healthy meals and do not skip any meals. Try to eat protein with a every meal and eat a healthy snack such as fruit or nuts in between meals. Try to keep a regular sleep-wake schedule and try to exercise daily, particularly in the form of walking, 20-30 minutes a day, if you can.   As far as your medications are concerned, I would like to suggest: Tramadol as prescribed  As far as diagnostic testing: MRI of the lumbar spine, physical therapy  I would like to see you back in 3 months, sooner if we need to. Please call us with any interim questions, concerns, problems, updates or refill requests.   Our phone number is [phone]. We also have an after hours call service for urgent matters and there is a physician on-call for urgent questions. For any emergencies you know to call 911 or go to the nearest emergency room

## 2015-09-30 NOTE — Progress Notes (Signed)
[facility]    Provider:  Dr Lucia Gaskins Referring Provider: Saralyn Pilar * Primary Care Physician:  Saralyn Pilar, DO  CC:  Back pain  HPI:  [name] is a 65 y.o. male here as a referral from Dr. Althea Charon for chronic low back. Patient's here with his wife who also provides information. Past medical history of coronary artery disease, hypertension, chronic low back pain and degenerative joint disease, hyperlipidemia, MI, emphysema, COPD on the shortness of breath, gout. He had cervical spine surgery in the 90s and since then he has had low back pain, progressive over the years. He was told he has scoliosis in the past. Now the pain is unbearable. The pain is in his lower back, he can't stand for a long time, he can't stand up straight and have to lean over. He needs to lean over the shopping cart in the store. He can't walk very long, he couldn't even walk from the reception area, he needs to sit down can't stop. Sitting down makes him feel better. He has pain shooting into his legs all the way down the lower legs and his legs give out. He has fallen because of his balance. No numbness or tingling in the toes. He has numbness of the right leg. Pain is 10/10. Back feels better when he lays down. No bowel or bladder changes. No other focal neurologic deficits. He has a gait problem that he has had ever since his cervical spine surgery and cervical cord damage.  Reviewed notes, labs and imaging from outside physicians, which showed:   CT of the head 11 2008 (personally reviewed images and agree with following)  There is no evidence for acute hemorrhage, hydrocephalus, mass/mass-effect or abnormal extra-axial fluid collection. No definite CT evidence for acute ischemia. Patchy low attenuation in the deep hemispheric white matter is nonspecific, but likely related to chronic small vessel disease. Ill-defined low attenuation is seen in the right frontal lobe,  adjacent to the lateral ventricle. This is nonspecific and may reflect chronic microvascular ischemic demyelination. However acute ischemia could have this appearance.  Visualized portions of the paranasal sinuses are clear.  IMPRESSION:  Indeterminate focal hypodensity in the right frontal lobe. This may be related to chronic microvascular ischemic demyelination although an area of acute ischemia could have this appearance. Please correlate clinically and consider MRI to further evaluate as warranted.   TSH 2.32, hepatitis C and HIV negative, CMP with a creatinine of 0.73 in March 2017.   Review of Systems: Patient complains of symptoms per HPI as well as the following symptoms: No CP, no SOB. Pertinent negatives per HPI. All others negative.   Social History   Social History  . Marital Status: Married    Spouse Name: N/A  . Number of Children: 4  . Years of Education: N/A   Occupational History  . Disabled    Social History Main Topics  . Smoking status: Former Smoker -- 1.00 packs/day for 30 years    Types: Cigarettes    Quit date: 05/17/2005  . Smokeless tobacco: Not on file  . Alcohol Use: 1.5 oz/week    3 Standard drinks or equivalent per week     Comment: occasionally  . Drug Use: No  . Sexual Activity: Yes   Other Topics Concern  . Not on file   Social History Narrative   Disabled veteran, lives in [place] with his wife      Previous smoker - quit 2007  Previous Etoh and marijuana, quit 2009    Family History  Problem Relation Age of Onset  . Heart attack Mother 42    Deceased at 42 from MI  . Pneumonia Father   . Hypertension Father   . Hypertension Sister   . Hypertension Sister     Past Medical History  Diagnosis Date  . Coronary artery disease     s/p NSTEMI 10/2007, RCA and LCx PCI with drug-eluting stents; patent stents cath 04/2011  . Hypertension     Poorly controlled  . DJD (degenerative joint disease)   .  Hypercholesterolemia   . Myocardial infarction (HCC)   . Emphysema   . Angina   . COPD (chronic obstructive pulmonary disease) (HCC)   . Shortness of breath   . Pneumonia   . Gout     Past Surgical History  Procedure Laterality Date  . Cervical disc surgery    . Coronary angioplasty with stent placement    . Left heart catheterization with coronary angiogram N/A 04/20/2011    Procedure: LEFT HEART CATHETERIZATION WITH CORONARY ANGIOGRAM;  Surgeon: Peter M Swaziland, MD;  Location: [facility] CATH LAB;  Service: Cardiovascular;  Laterality: N/A;    Current Outpatient Prescriptions  Medication Sig Dispense Refill  . allopurinol (ZYLOPRIM) 100 MG tablet Take 1 tablet (100 mg total) by mouth daily. Start taking 2 weeks after gout flare resolved. 90 tablet 3  . amLODipine (NORVASC) 10 MG tablet Take 1 tablet (10 mg total) by mouth daily. 90 tablet 3  . aspirin 81 MG tablet Take 81 mg by mouth daily.      Marland Kitchen atorvastatin (LIPITOR) 40 MG tablet Take 1 tablet (40 mg total) by mouth daily. 90 tablet 3  . carvedilol (COREG) 6.25 MG tablet Take 1 tablet (6.25 mg total) by mouth 2 (two) times daily with a meal. 180 tablet 3  . clopidogrel (PLAVIX) 75 MG tablet Take 1 tablet (75 mg total) by mouth daily. 90 tablet 3  . colchicine 0.6 MG tablet Take 1 tablet (0.6 mg total) by mouth daily. 90 tablet 1  . levothyroxine (LEVOTHROID) 25 MCG tablet Take 1 tablet (25 mcg total) by mouth daily before breakfast. 30 tablet 0  . lisinopril (PRINIVIL,ZESTRIL) 40 MG tablet Take 1 tablet (40 mg total) by mouth daily. 90 tablet 3  . nitroGLYCERIN (NITROSTAT) 0.4 MG SL tablet Place 1 tablet (0.4 mg total) under the tongue every 5 (five) minutes x 3 doses as needed. For shortness of breath 25 tablet 3  . pantoprazole (PROTONIX) 40 MG tablet TAKE 1 TABLET (40 MG TOTAL) BY MOUTH DAILY. 90 tablet 3  . tadalafil (CIALIS) 20 MG tablet Take 0.5-1 tablets (10-20 mg total) by mouth every other day as needed for erectile dysfunction. Do  NOT take Nitroglycerin with this med. 5 tablet 11   No current facility-administered medications for this visit.    Allergies as of 09/30/2015  . (No Known Allergies)    Vitals: BP 112/72 mmHg  Pulse 86  Resp 20  Ht  (1.854 m)  Wt 180 lb (81.647 kg)  BMI 23.75 kg/m2 Last Weight:  Wt Readings from Last 1 Encounters:  09/30/15 180 lb (81.647 kg)   Last Height:   Ht Readings from Last 1 Encounters:  09/30/15  (1.854 m)   Physical exam: Exam: Gen: NAD, conversant, well nourised, well groomed                     CV:  RRR, no MRG. No Carotid Bruits. No peripheral edema, warm, nontender Eyes: Conjunctivae clear without exudates or hemorrhage  Neuro: Detailed Neurologic Exam  Speech:    Speech is normal; fluent and spontaneous with normal comprehension.  Cognition:    The patient is oriented to person, place, and time;     recent and remote memory intact;     language fluent;     normal attention, concentration,     fund of knowledge Cranial Nerves:    The pupils are equal, round, and reactive to light. Attempted funduscopic exam could not visualize due to small pupils. Visual fields are full to finger confrontation. Extraocular movements are intact. Trigeminal sensation is intact and the muscles of mastication are normal. The face is symmetric. The palate elevates in the midline. Hearing intact. Voice is normal. Shoulder shrug is normal. The tongue has normal motion without fasciculations.   Coordination:  No dysmetria noted  Gait:     Antalgic, spastic  Motor Observation:    No asymmetry, no atrophy, and no involuntary movements noted. Tone:  Increased tone in the legs  Posture:    Posture is normal. normal erect    Strength:  Right hip flexion 3+/5 Left hip flexion 4+/5 Right leg flexion and foot dorsiflexion 5-/5 Otherwise strength is V/V in the upper and lower limbs.      Sensation: intact to LT     Reflex Exam:  DTR's:    Deep tendon  reflexes in the upper and lower extremities are brisk bilaterally.   Toes:    The toes are downgoing bilaterally.   Clonus:    Clonus is present in the patellars.       Assessment/Plan:  65 year old male with chronic low back pain, now worsening with radiculopathy as well as neurogenic claudication, Falls. 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 Recommend at this time that we obtain an MRI of the lumbar spine. Appointments may consider MRI of the brain and cervical cord given findings on exam and previous CT of the head in 2008 with a hypodensity in the right frontal lobe.. This was discussed with patient and his wife who are in agreement.  Naomie DeanAntonia Brilyn Tuller, MD  [facility] [address] [place], [place] [id number]  Phone [phone] Fax [phone]

## 2015-09-30 NOTE — Telephone Encounter (Signed)
Reviewed chart, significant PMH with Chronic Low Back Pain and Weakness with history of Lumbar Spinal Stenosis and Radiculopathy. Now established with Neurology as well.  Completed paperwork for handicap placard. Medical indications include: - Cannot walk 200 ft without stopping to rest - Is severely limited in their ability to walk due to an arthritic, neurological, or orthopedic condition  Form signed, dated, 09/30/15 placed in Tamika EvartsMartin RN mailbox in her office, for appropriate filing and notifying family to pickup.  Saralyn PilarAlexander Karamalegos, DO [facility], PGY-3

## 2015-10-01 NOTE — Telephone Encounter (Signed)
Left voice message for patient that form is complete and ready for pick up.  Wrigley Winborne L, RN  

## 2015-10-07 ENCOUNTER — Other Ambulatory Visit: Payer: Medicare Other

## 2015-10-15 ENCOUNTER — Ambulatory Visit
Admission: RE | Admit: 2015-10-15 | Discharge: 2015-10-15 | Disposition: A | Discharge status: Home / Self Care | Payer: Medicare Other | Source: Ambulatory Visit | Attending: Neurology | Admitting: Neurology

## 2015-10-15 DIAGNOSIS — M5417 Radiculopathy, lumbosacral region: Secondary | ICD-10-CM

## 2015-10-15 DIAGNOSIS — R29898 Other symptoms and signs involving the musculoskeletal system: Secondary | ICD-10-CM

## 2015-10-15 DIAGNOSIS — R252 Cramp and spasm: Secondary | ICD-10-CM

## 2015-10-15 DIAGNOSIS — M5126 Other intervertebral disc displacement, lumbar region: Secondary | ICD-10-CM | POA: Diagnosis not present

## 2015-10-15 DIAGNOSIS — M4806 Spinal stenosis, lumbar region: Secondary | ICD-10-CM | POA: Diagnosis not present

## 2015-10-15 DIAGNOSIS — R269 Unspecified abnormalities of gait and mobility: Secondary | ICD-10-CM

## 2015-10-15 DIAGNOSIS — M48061 Spinal stenosis, lumbar region without neurogenic claudication: Secondary | ICD-10-CM

## 2015-10-15 DIAGNOSIS — W19XXXA Unspecified fall, initial encounter: Secondary | ICD-10-CM

## 2015-10-20 ENCOUNTER — Other Ambulatory Visit: Payer: Self-pay | Admitting: Neurology

## 2015-10-20 ENCOUNTER — Telehealth: Payer: Self-pay | Admitting: *Deleted

## 2015-10-20 DIAGNOSIS — M545 Low back pain: Secondary | ICD-10-CM

## 2015-10-20 DIAGNOSIS — M5416 Radiculopathy, lumbar region: Secondary | ICD-10-CM

## 2015-10-20 NOTE — Telephone Encounter (Signed)
Dr Lucia GaskinsAhern- pt would like referral. Can you put it in?  thank you!  Called and spoke to pt about MRI lumbar results per Dr Lucia GaskinsAhern note. Pt would like referral to Dr Ethelene Halamos. Advised we will place referral and he should get a call to schedule appt within a week or two. If not, call me back so we can make sure he gets an appt. He verbalized understanding.

## 2015-10-20 NOTE — Telephone Encounter (Signed)
-----   Message from Anson FretAntonia B Ahern, MD sent at 10/18/2015 12:23 PM EDT ----- He has arthritic changes throughout his spine. It appears he may also have some nerves that are being pinched. The best thing to do for his low back pain is refer him to Dr. Ethelene Halamos for possible interventions like epidural steroid injections. Please discuss with patient and I can place the referral. I still would like to see him as a follow up thanks

## 2015-10-20 NOTE — Telephone Encounter (Signed)
Done. thanks

## 2015-11-21 DIAGNOSIS — M47816 Spondylosis without myelopathy or radiculopathy, lumbar region: Secondary | ICD-10-CM | POA: Diagnosis not present

## 2015-11-21 DIAGNOSIS — M5136 Other intervertebral disc degeneration, lumbar region: Secondary | ICD-10-CM | POA: Diagnosis not present

## 2015-12-17 DIAGNOSIS — M5416 Radiculopathy, lumbar region: Secondary | ICD-10-CM | POA: Diagnosis not present

## 2016-01-20 ENCOUNTER — Other Ambulatory Visit: Payer: Self-pay | Admitting: *Deleted

## 2016-01-20 DIAGNOSIS — M1A09X0 Idiopathic chronic gout, multiple sites, without tophus (tophi): Secondary | ICD-10-CM

## 2016-01-20 MED ORDER — COLCHICINE 0.6 MG PO TABS: 0.6000 mg | ORAL_TABLET | Freq: Every day | ORAL | 1 refills | Status: DC

## 2016-02-10 DIAGNOSIS — M5136 Other intervertebral disc degeneration, lumbar region: Secondary | ICD-10-CM | POA: Diagnosis not present

## 2016-02-10 DIAGNOSIS — M5416 Radiculopathy, lumbar region: Secondary | ICD-10-CM | POA: Diagnosis not present

## 2016-02-10 DIAGNOSIS — M47816 Spondylosis without myelopathy or radiculopathy, lumbar region: Secondary | ICD-10-CM | POA: Diagnosis not present

## 2016-03-03 DIAGNOSIS — M5416 Radiculopathy, lumbar region: Secondary | ICD-10-CM | POA: Diagnosis not present

## 2016-06-28 ENCOUNTER — Other Ambulatory Visit: Payer: Self-pay | Admitting: *Deleted

## 2016-06-28 DIAGNOSIS — I10 Essential (primary) hypertension: Secondary | ICD-10-CM

## 2016-06-28 MED ORDER — LISINOPRIL 40 MG PO TABS: 40.0000 mg | ORAL_TABLET | Freq: Every day | ORAL | 3 refills | Status: DC

## 2016-08-13 ENCOUNTER — Other Ambulatory Visit: Payer: Self-pay | Admitting: Internal Medicine

## 2016-08-13 DIAGNOSIS — I2510 Atherosclerotic heart disease of native coronary artery without angina pectoris: Secondary | ICD-10-CM

## 2016-08-13 DIAGNOSIS — M1A09X0 Idiopathic chronic gout, multiple sites, without tophus (tophi): Secondary | ICD-10-CM

## 2016-08-13 DIAGNOSIS — K219 Gastro-esophageal reflux disease without esophagitis: Secondary | ICD-10-CM

## 2016-08-16 MED ORDER — ALLOPURINOL 100 MG PO TABS: 100.0000 mg | ORAL_TABLET | Freq: Every day | ORAL | 3 refills | Status: DC

## 2016-08-16 MED ORDER — CLOPIDOGREL BISULFATE 75 MG PO TABS: 75.0000 mg | ORAL_TABLET | Freq: Every day | ORAL | 3 refills | Status: DC

## 2016-08-16 MED ORDER — PANTOPRAZOLE SODIUM 40 MG PO TBEC: DELAYED_RELEASE_TABLET | ORAL | 3 refills | Status: DC

## 2016-09-08 ENCOUNTER — Other Ambulatory Visit: Payer: Self-pay | Admitting: *Deleted

## 2016-09-08 ENCOUNTER — Telehealth: Payer: Self-pay

## 2016-09-08 DIAGNOSIS — I2510 Atherosclerotic heart disease of native coronary artery without angina pectoris: Secondary | ICD-10-CM

## 2016-09-08 DIAGNOSIS — I10 Essential (primary) hypertension: Secondary | ICD-10-CM

## 2016-09-08 MED ORDER — AMLODIPINE BESYLATE 10 MG PO TABS: 10.0000 mg | ORAL_TABLET | Freq: Every day | ORAL | 3 refills | Status: DC

## 2016-09-08 MED ORDER — CARVEDILOL 6.25 MG PO TABS: 6.2500 mg | ORAL_TABLET | Freq: Two times a day (BID) | ORAL | 3 refills | Status: DC

## 2016-09-08 MED ORDER — ATORVASTATIN CALCIUM 40 MG PO TABS: 40.0000 mg | ORAL_TABLET | Freq: Every day | ORAL | 3 refills | Status: DC

## 2016-09-08 NOTE — Telephone Encounter (Signed)
Pt needs refills on Carvedilol, atorvastatin and amlodipine. Pt has an appt on 5/30 at 8:30 am. That was the first morning appointment available. Sunday Spillers, CMA

## 2016-09-08 NOTE — Telephone Encounter (Signed)
Pt would like refill tadalafil. Please send [facility] on [address] Sunday Spillers, CMA

## 2016-09-09 NOTE — Telephone Encounter (Signed)
Patient will need to come in before Tadalafil can be refilled given he has a cardiac hx.

## 2016-09-21 ENCOUNTER — Ambulatory Visit (INDEPENDENT_AMBULATORY_CARE_PROVIDER_SITE_OTHER): Payer: Medicare Other | Admitting: Internal Medicine

## 2016-09-21 VITALS — BP 148/78 | HR 96 | Temp 98.6°F | Wt 164.0 lb

## 2016-09-21 DIAGNOSIS — R634 Abnormal weight loss: Secondary | ICD-10-CM

## 2016-09-21 DIAGNOSIS — N529 Male erectile dysfunction, unspecified: Secondary | ICD-10-CM

## 2016-09-21 DIAGNOSIS — I2510 Atherosclerotic heart disease of native coronary artery without angina pectoris: Secondary | ICD-10-CM

## 2016-09-21 DIAGNOSIS — E785 Hyperlipidemia, unspecified: Secondary | ICD-10-CM | POA: Diagnosis not present

## 2016-09-21 DIAGNOSIS — M10032 Idiopathic gout, left wrist: Secondary | ICD-10-CM | POA: Diagnosis not present

## 2016-09-21 DIAGNOSIS — I10 Essential (primary) hypertension: Secondary | ICD-10-CM | POA: Diagnosis not present

## 2016-09-21 DIAGNOSIS — R109 Unspecified abdominal pain: Secondary | ICD-10-CM | POA: Insufficient documentation

## 2016-09-21 DIAGNOSIS — R1084 Generalized abdominal pain: Secondary | ICD-10-CM | POA: Diagnosis present

## 2016-09-21 LAB — POCT H PYLORI SCREEN: H Pylori Screen, POC: NEGATIVE

## 2016-09-21 MED ORDER — RANITIDINE HCL 300 MG PO TABS: 300.0000 mg | ORAL_TABLET | Freq: Every day | ORAL | 3 refills | Status: DC

## 2016-09-21 MED ORDER — TADALAFIL 20 MG PO TABS: 10.0000 mg | ORAL_TABLET | ORAL | 11 refills | Status: DC | PRN

## 2016-09-21 NOTE — Patient Instructions (Addendum)
It was nice meeting you today Mr. Grace BushyBooker!  For your high blood pressure, please increase your dose of amlodipine (Norvasc) from 10mg  a day to 20mg  a day. You can take two 10mg  tablets. When you run out of refills, please call the office and I will send in a new prescription for the 20mg  tablets.   Please also begin taking Zantac 300mg  once a day. Continue to take pantoprazole (Protonix) as you have been. Do not take Prevacid with Protonix, because they are essentially the same medication. You can take Tums or use Maalox if you need to.   Please continue taking all your other medications as you have been.   It is important to schedule your appointments with Dr. Excell Seltzerooper and Dr. Ethelene Halamos, so please be sure to do that at your earliest convenience.  You will be contacted by the gastroenterologist's (stomach doctor) office to set up your colonoscopy. I strongly recommend having this performed to make sure you do not have colon cancer.    If you have any questions or concerns, please feel free to call the clinic.   Be well,  Dr. Natale MilchLancaster

## 2016-09-21 NOTE — Assessment & Plan Note (Signed)
Stable with no recent flares.  - Continue current regimen

## 2016-09-21 NOTE — Assessment & Plan Note (Signed)
Stable without angina. Continuing DAPT per cards recommendation.  - Continue aspirin and Plavix - Pt to schedule f/u appt with Dr. Excell Seltzerooper tomorrow - Continue ACEI, BB, statin

## 2016-09-21 NOTE — Assessment & Plan Note (Signed)
Continue atorvastatin

## 2016-09-21 NOTE — Assessment & Plan Note (Signed)
Will refill Cialis today. Reiterated not to be taken with nitroglycerin.

## 2016-09-21 NOTE — Assessment & Plan Note (Signed)
BP elevated today at 150/72 with repeat 148/78. Systolics consistently in 150s at home as well per patient report.  - Increase amlodipine from 10mg  to 20mg  today - Continue carvedilol and lisinopril as prescribed

## 2016-09-21 NOTE — Assessment & Plan Note (Signed)
Generalized burning sensation in AM after eating. Taking Protonix with PRN Prevacid and Maalox which does provide relief. Unintentional weight loss of 20 pounds in last year. No dysphagia, hematochezia/melena, early satiety, or additional red flags. Has never had colonoscopy. H.pylori negative. Will continue to treat as GERD for now, and patient agreeable to scheduling colonoscopy. If further weight loss and/or no improvement in symptoms, would consider upper endoscopy to rule out malignancy.  - Continue Protonix - Begin Zantac - Instructed not to continue Prevacid - Can take Tums or Maalox PRN - Consult to GI for colonoscopy

## 2016-09-21 NOTE — Progress Notes (Signed)
Subjective:   Patient: [name]       Birthdate: July 01, 1950       MRN: [id number]      HPI  [name] is a 66 y.o. male presenting for abdominal burning and f/u on chronic medical conditions.   Abdominal burning Present for a while now. Patient reports that it happens only intermittent, however wife reports that patient complains of it daily. Describes as burning sensation in abdomen. Denies nausea, vomiting, constipation, diarrhea, early satiety, dysphagia, unusual taste in mouth, melena, hematochezia. Endorses unintentional weight loss, though says he thinks this is due to stress ad he could easily regain the weight if he wanted to. Takes Protonix daily. Also takes Prevacid and Maalox. Has taken Tums in the past which did not help. Cannot identify specific foods that seem to trigger symptoms. Symptoms always occur in the morning after he eats breakfast. Usually eats eggs and bacon for breakfast. Sometimes eats cinnamon rolls. Pain occurs regardless of what he eats for breakfast. Episodes usually last less than a minute and resolve spontaneously.   HTN Taking amlodipine 10mg , lisinopril, and carvedilol. Checks BP at home. Usually with systolics in 140s-150s. Denies headaches, vision changes.   Gout Has not had a flare in over a year. Is taking allopurinol and colchicine daily as prescribed.  HLD Taking atorvastatin as prescribed.   CAD Last seen by Dr. Excell Seltzer in 2/17. Per Dr. Earmon Phoenix note, plan is to continue on DAPT with aspirin and Plavix and f/u in one year. Patient taking aspirin and Plavix as prescribed. He has not yet scheduled f/u appt with Dr. Excell Seltzer, but his wife says she is going to call tomorrow. Denies chest pain, SOB, swelling.   Erectile Dysfunction Was initially prescribed Cialis by Dr. Excell Seltzer. Prescription was refilled by Dr. Althea Charon in 02/17, however patient never picked up prescription. He would like this medication refilled again. He knows not to take  it with nitroglycerin.   Smoking status reviewed. Patient is former smoker.   Review of Systems See HPI.     Objective:  Physical Exam  Constitutional: He is oriented to person, place, and time and well-developed, well-nourished, and in no distress.  HENT:  Head: Normocephalic and atraumatic.  Nose: Nose normal.  Mouth/Throat: Oropharynx is clear and moist. No oropharyngeal exudate.  Eyes: Conjunctivae and EOM are normal. Pupils are equal, round, and reactive to light. Right eye exhibits no discharge. Left eye exhibits no discharge.  Cardiovascular: Normal rate, regular rhythm and normal heart sounds.   No murmur heard. Pulmonary/Chest: Effort normal and breath sounds normal. No respiratory distress. He has no wheezes.  Abdominal: Soft. Bowel sounds are normal. He exhibits no distension. There is no tenderness.  Neurological: He is alert and oriented to person, place, and time.  Skin: Skin is warm and dry.  Psychiatric: Affect and judgment normal.      Assessment & Plan:  HYPERTENSION, BENIGN BP elevated today at 150/72 with repeat 148/78. Systolics consistently in 150s at home as well per patient report.  - Increase amlodipine from 10mg  to 20mg  today - Continue carvedilol and lisinopril as prescribed  CAD, NATIVE VESSEL Stable without angina. Continuing DAPT per cards recommendation.  - Continue aspirin and Plavix - Pt to schedule f/u appt with Dr. Excell Seltzer tomorrow - Continue ACEI, BB, statin  Erectile dysfunction Will refill Cialis today. Reiterated not to be taken with nitroglycerin.   Hyperlipidemia Continue atorvastatin.  Gout Stable with no recent flares.  - Continue current  regimen  Abdominal pain Generalized burning sensation in AM after eating. Taking Protonix with PRN Prevacid and Maalox which does provide relief. Unintentional weight loss of 20 pounds in last year. No dysphagia, hematochezia/melena, early satiety, or additional red flags. Has never had  colonoscopy. H.pylori negative. Will continue to treat as GERD for now, and patient agreeable to scheduling colonoscopy. If further weight loss and/or no improvement in symptoms, would consider upper endoscopy to rule out malignancy.  - Continue Protonix - Begin Zantac - Instructed not to continue Prevacid - Can take Tums or Maalox PRN - Consult to GI for colonoscopy   Tarri AbernethyAbigail J Yue Flanigan, MD, MPH PGY-2 [facility] Pager [phone]

## 2016-10-01 ENCOUNTER — Telehealth: Payer: Self-pay | Admitting: Internal Medicine

## 2016-10-13 ENCOUNTER — Encounter: Payer: Medicaid Other | Admitting: Internal Medicine

## 2016-10-20 ENCOUNTER — Encounter: Payer: Self-pay | Admitting: Internal Medicine

## 2016-10-28 DIAGNOSIS — M5136 Other intervertebral disc degeneration, lumbar region: Secondary | ICD-10-CM | POA: Diagnosis not present

## 2016-10-28 DIAGNOSIS — M47816 Spondylosis without myelopathy or radiculopathy, lumbar region: Secondary | ICD-10-CM | POA: Diagnosis not present

## 2016-11-01 ENCOUNTER — Ambulatory Visit: Payer: Medicaid Other | Admitting: Internal Medicine

## 2016-11-08 ENCOUNTER — Encounter: Payer: Self-pay | Admitting: Nurse Practitioner

## 2016-11-08 ENCOUNTER — Ambulatory Visit (INDEPENDENT_AMBULATORY_CARE_PROVIDER_SITE_OTHER): Payer: Medicare Other | Admitting: Nurse Practitioner

## 2016-11-08 VITALS — BP 150/76 | HR 70 | Ht 73.0 in | Wt 167.4 lb

## 2016-11-08 DIAGNOSIS — I2510 Atherosclerotic heart disease of native coronary artery without angina pectoris: Secondary | ICD-10-CM

## 2016-11-08 DIAGNOSIS — I10 Essential (primary) hypertension: Secondary | ICD-10-CM

## 2016-11-08 DIAGNOSIS — E7800 Pure hypercholesterolemia, unspecified: Secondary | ICD-10-CM

## 2016-11-08 MED ORDER — NITROGLYCERIN 0.4 MG SL SUBL: 0.4000 mg | SUBLINGUAL_TABLET | SUBLINGUAL | 3 refills | Status: DC | PRN

## 2016-11-08 MED ORDER — AMLODIPINE BESYLATE 10 MG PO TABS: 10.0000 mg | ORAL_TABLET | Freq: Every day | ORAL | 3 refills | Status: DC

## 2016-11-08 MED ORDER — CARVEDILOL 6.25 MG PO TABS: 6.2500 mg | ORAL_TABLET | Freq: Two times a day (BID) | ORAL | 3 refills | Status: DC

## 2016-11-08 MED ORDER — CLOPIDOGREL BISULFATE 75 MG PO TABS: 75.0000 mg | ORAL_TABLET | Freq: Every day | ORAL | 3 refills | Status: DC

## 2016-11-08 MED ORDER — ATORVASTATIN CALCIUM 40 MG PO TABS: 40.0000 mg | ORAL_TABLET | Freq: Every day | ORAL | 3 refills | Status: DC

## 2016-11-08 NOTE — Progress Notes (Signed)
CARDIOLOGY OFFICE NOTE  Date:  11/08/2016    [name] Date of Birth: [birth year] Medical Record #[id number]  PCP:  Marquette Saa, MD  Cardiologist:  Excell Seltzer    Chief Complaint  Patient presents with  . Coronary Artery Disease    Follow up visit - seen for Dr. Excell Seltzer    History of Present Illness: [name] is a 66 y.o. male who presents today for a follow up visit. Seen for Dr. Excell Seltzer.   He has known CAD - he initially presented in 2009 with a non-ST elevation infarction. He was treated with multivessel stenting of the left circumflex and right coronary arteries. He returned in 2012 with chest pain and underwent repeat cardiac catheterization. His stents were patent and he had mild to moderate diffuse disease elsewhere. Medical therapy was recommended.  He was last seen back in February of 2017 and was doing ok - had ran out of his medicines and was questioning long term use of Plavix. Dr. Excell Seltzer favored continued long term use of his Plavix in light of his multiple stents.   Comes in today. Here with his wife. Using a cane. Limps because of his back - lots of DJD. Says he is doing well from our standpoint. No chest pain. Breathing is good. Thinking about trying to start swimming. He is avoiding the heat. His back does limit his ability to exercise. He has lost weight - feels like this has been intention - his wife's job changed in regards to the time she was at work which affected when he would (and thus did not) eat. He is happy with how he is doing and they both have no real concerns today.   Past Medical History:  Diagnosis Date  . Angina   . COPD (chronic obstructive pulmonary disease) (HCC)   . Coronary artery disease    s/p NSTEMI 10/2007, RCA and LCx PCI with drug-eluting stents; patent stents cath 04/2011  . DJD (degenerative joint disease)   . Emphysema   . Gout   . Hypercholesterolemia   . Hypertension    Poorly controlled  . Myocardial  infarction (HCC)   . Pneumonia   . Shortness of breath     Past Surgical History:  Procedure Laterality Date  . CERVICAL DISC SURGERY    . CORONARY ANGIOPLASTY WITH STENT PLACEMENT    . LEFT HEART CATHETERIZATION WITH CORONARY ANGIOGRAM N/A 04/20/2011   Procedure: LEFT HEART CATHETERIZATION WITH CORONARY ANGIOGRAM;  Surgeon: Peter M Swaziland, MD;  Location: [facility] CATH LAB;  Service: Cardiovascular;  Laterality: N/A;     Medications: Current Meds  Medication Sig  . allopurinol (ZYLOPRIM) 100 MG tablet Take 1 tablet (100 mg total) by mouth daily. Start taking 2 weeks after gout flare resolved.  Marland Kitchen amLODipine (NORVASC) 10 MG tablet Take 1 tablet (10 mg total) by mouth daily.  Marland Kitchen aspirin 81 MG tablet Take 81 mg by mouth daily.    Marland Kitchen atorvastatin (LIPITOR) 40 MG tablet Take 1 tablet (40 mg total) by mouth daily.  . carvedilol (COREG) 6.25 MG tablet Take 1 tablet (6.25 mg total) by mouth 2 (two) times daily with a meal.  . clopidogrel (PLAVIX) 75 MG tablet Take 1 tablet (75 mg total) by mouth daily.  . colchicine 0.6 MG tablet TAKE 1 TABLET (0.6 MG TOTAL) BY MOUTH DAILY.  Marland Kitchen levothyroxine (LEVOTHROID) 25 MCG tablet Take 1 tablet (25 mcg total) by mouth daily before breakfast.  . lisinopril (PRINIVIL,ZESTRIL)  40 MG tablet Take 1 tablet (40 mg total) by mouth daily.  . nitroGLYCERIN (NITROSTAT) 0.4 MG SL tablet Place 1 tablet (0.4 mg total) under the tongue every 5 (five) minutes x 3 doses as needed. For shortness of breath  . pantoprazole (PROTONIX) 40 MG tablet TAKE 1 TABLET (40 MG TOTAL) BY MOUTH DAILY.  . ranitidine (ZANTAC) 300 MG tablet Take 1 tablet (300 mg total) by mouth at bedtime.  . tadalafil (CIALIS) 20 MG tablet Take 0.5-1 tablets (10-20 mg total) by mouth every other day as needed for erectile dysfunction. Do NOT take Nitroglycerin with this med.  . traMADol (ULTRAM) 50 MG tablet Take 1-2 tablets every 4-6 hours  . [DISCONTINUED] amLODipine (NORVASC) 10 MG tablet Take 1 tablet (10 mg  total) by mouth daily.  . [DISCONTINUED] atorvastatin (LIPITOR) 40 MG tablet Take 1 tablet (40 mg total) by mouth daily.  . [DISCONTINUED] carvedilol (COREG) 6.25 MG tablet Take 1 tablet (6.25 mg total) by mouth 2 (two) times daily with a meal.  . [DISCONTINUED] clopidogrel (PLAVIX) 75 MG tablet Take 1 tablet (75 mg total) by mouth daily.  . [DISCONTINUED] nitroGLYCERIN (NITROSTAT) 0.4 MG SL tablet Place 1 tablet (0.4 mg total) under the tongue every 5 (five) minutes x 3 doses as needed. For shortness of breath     Allergies: No Known Allergies  Social History: The patient  reports that he quit smoking about 11 years ago. His smoking use included Cigarettes. He has a 30.00 pack-year smoking history. He has never used smokeless tobacco. He reports that he drinks about 1.5 oz of alcohol per week . He reports that he does not use drugs.   Family History: The patient's family history includes Heart attack (age of onset: 32) in his mother; Hypertension in his father, sister, and sister; Pneumonia in his father.   Review of Systems: Please see the history of present illness.   Otherwise, the review of systems is positive for none.   All other systems are reviewed and negative.   Physical Exam: VS:  BP (!) 150/76 (BP Location: Left Arm, Patient Position: Sitting, Cuff Size: Normal)   Pulse 70   Ht 6\' 1"  (1.854 m)   Wt 167 lb 6.4 oz (75.9 kg)   BMI 22.09 kg/m  .  BMI Body mass index is 22.09 kg/m.  Wt Readings from Last 3 Encounters:  11/08/16 167 lb 6.4 oz (75.9 kg)  09/21/16 164 lb (74.4 kg)  09/30/15 180 lb (81.6 kg)   BP is 140/80 by me.   General: Pleasant. He has lost weight. He is alert and in no acute distress.   HEENT: Normal.  Neck: Supple, no JVD, carotid bruits, or masses noted.  Cardiac: Regular rate and rhythm. No murmurs, rubs, or gallops. No edema.  Respiratory:  Lungs are clear to auscultation bilaterally with normal work of breathing.  GI: Soft and nontender.  MS:  No deformity or atrophy. Gait and ROM intact but he limps. He is using a cane.  Skin: Warm and dry. Color is normal.  Neuro:  Strength and sensation are intact and no gross focal deficits noted.  Psych: Alert, appropriate and with normal affect.   LABORATORY DATA:  EKG:  EKG is ordered today. This demonstrates NSR with nonspecific ST and T wave changes.  Lab Results  Component Value Date   WBC 4.5 05/23/2014   HGB 13.1 05/23/2014   HCT 39.4 05/23/2014   PLT 216 05/23/2014   GLUCOSE 100 (H) 07/28/2015  CHOL 123 (L) 07/28/2015   TRIG 140 07/28/2015   HDL 53 07/28/2015   LDLDIRECT 125.6 01/31/2013   LDLCALC 42 07/28/2015   ALT 23 07/28/2015   AST 19 07/28/2015   NA 142 07/28/2015   K 3.5 07/28/2015   CL 104 07/28/2015   CREATININE 0.73 07/28/2015   BUN 10 07/28/2015   CO2 25 07/28/2015   TSH 2.32 07/28/2015   INR 1.22 04/20/2011   HGBA1C 6.0 (H) 05/21/2014     BNP (last 3 results) No results for input(s): BNP in the last 8760 hours.  ProBNP (last 3 results) No results for input(s): PROBNP in the last 8760 hours.   Other Studies Reviewed Today:  2D Echo 05/22/2014: Study Conclusions  - Left ventricle: The cavity size was normal. Systolic function was normal. The estimated ejection fraction was in the range of 60% to 65%. Wall motion was normal; there were no regional wall motion abnormalities. Left ventricular diastolic function parameters were normal.   ASSESSMENT AND PLAN: 1.  CAD, native vessel:  He's been treated with multiple drug-eluting stents in the past, especially in the right coronary artery where there are multiple overlapping stents which were deployed to treat a long segment of disease. It has been felt to be reasonable to continue him on long-term dual antiplatelet therapy with aspirin and Plavix. He continues to do well clinically. Lab today.   2. Essential HTN:  Recheck of his BP is better. He has had stable outpatient control reported.  No changes made today.   3. Hyperlipidemia:  Treated with atorvastatin 40 mg daily. He needs labs today.   4. Weight loss - seems like this has been intentional. Lab today to include TSh.   Current medicines are reviewed with the patient today.  The patient does not have concerns regarding medicines other than what has been noted above.  The following changes have been made:  See above.  Labs/ tests ordered today include:    Orders Placed This Encounter  Procedures  . Basic metabolic panel  . Hepatic function panel  . Lipid panel  . CBC  . TSH  . EKG 12-Lead     Disposition:   FU with Dr. Excell Seltzerooper in one year.  I will be happy to see back as needed.   Patient is agreeable to this plan and will call if any problems develop in the interim.   SignedNorma Fredrickson: Alette Kataoka, NP  11/08/2016 2:00 PM  [facility] [address] [place], [place]  [address] Phone: [phone] Fax: [phone]

## 2016-11-08 NOTE — Patient Instructions (Addendum)
We will be checking the following labs today - BMET, CBC, HPF, Lipids and TSH   Medication Instructions:    Continue with your current medicines.     Testing/Procedures To Be Arranged:  N/A  Follow-Up:   See Dr. Excell Seltzerooper in one year    Other Special Instructions:   N/A    If you need a refill on your cardiac medications before your next appointment, please call your pharmacy.   Call the [facility] office at [phone] if you have any questions, problems or concerns.

## 2016-11-09 ENCOUNTER — Other Ambulatory Visit: Payer: Self-pay | Admitting: *Deleted

## 2016-11-09 DIAGNOSIS — E876 Hypokalemia: Secondary | ICD-10-CM

## 2016-11-09 LAB — LIPID PANEL
Chol/HDL Ratio: 2.3 ratio (ref 0.0–5.0)
Cholesterol, Total: 136 mg/dL (ref 100–199)
HDL: 60 mg/dL (ref >39)
LDL Calculated: 31 mg/dL (ref 0–99)
Triglycerides: 223 mg/dL — ABNORMAL HIGH (ref 0–149)
VLDL Cholesterol Cal: 45 mg/dL — ABNORMAL HIGH (ref 5–40)

## 2016-11-09 LAB — HEPATIC FUNCTION PANEL
ALT: 14 IU/L (ref 0–44)
AST: 14 IU/L (ref 0–40)
Albumin: 4.3 g/dL (ref 3.6–4.8)
Alkaline Phosphatase: 59 IU/L (ref 39–117)
Bilirubin Total: 0.3 mg/dL (ref 0.0–1.2)
Bilirubin, Direct: 0.08 mg/dL (ref 0.00–0.40)
Total Protein: 7.1 g/dL (ref 6.0–8.5)

## 2016-11-09 LAB — BASIC METABOLIC PANEL
BUN/Creatinine Ratio: 11 (ref 10–24)
BUN: 8 mg/dL (ref 8–27)
CO2: 23 mmol/L (ref 20–29)
Calcium: 9.3 mg/dL (ref 8.6–10.2)
Chloride: 104 mmol/L (ref 96–106)
Creatinine, Ser: 0.7 mg/dL — ABNORMAL LOW (ref 0.76–1.27)
GFR calc Af Amer: 114 mL/min/{1.73_m2} (ref >59)
GFR calc non Af Amer: 99 mL/min/{1.73_m2} (ref >59)
Glucose: 97 mg/dL (ref 65–99)
Potassium: 3.4 mmol/L — ABNORMAL LOW (ref 3.5–5.2)
Sodium: 148 mmol/L — ABNORMAL HIGH (ref 134–144)

## 2016-11-09 LAB — CBC
Hematocrit: 36.6 % — ABNORMAL LOW (ref 37.5–51.0)
Hemoglobin: 12.2 g/dL — ABNORMAL LOW (ref 13.0–17.7)
MCH: 26 pg — ABNORMAL LOW (ref 26.6–33.0)
MCHC: 33.3 g/dL (ref 31.5–35.7)
MCV: 78 fL — ABNORMAL LOW (ref 79–97)
Platelets: 258 10*3/uL (ref 150–379)
RBC: 4.69 x10E6/uL (ref 4.14–5.80)
RDW: 15.4 % (ref 12.3–15.4)
WBC: 5 10*3/uL (ref 3.4–10.8)

## 2016-11-09 LAB — TSH: TSH: 2.63 u[IU]/mL (ref 0.450–4.500)

## 2016-11-16 DIAGNOSIS — M47816 Spondylosis without myelopathy or radiculopathy, lumbar region: Secondary | ICD-10-CM | POA: Diagnosis not present

## 2016-11-16 DIAGNOSIS — M5416 Radiculopathy, lumbar region: Secondary | ICD-10-CM | POA: Diagnosis not present

## 2016-12-01 ENCOUNTER — Other Ambulatory Visit: Payer: Medicare Other | Admitting: *Deleted

## 2016-12-01 DIAGNOSIS — E876 Hypokalemia: Secondary | ICD-10-CM

## 2016-12-01 LAB — BASIC METABOLIC PANEL
BUN/Creatinine Ratio: 8 — ABNORMAL LOW (ref 10–24)
BUN: 7 mg/dL — ABNORMAL LOW (ref 8–27)
CO2: 22 mmol/L (ref 20–29)
Calcium: 9 mg/dL (ref 8.6–10.2)
Chloride: 103 mmol/L (ref 96–106)
Creatinine, Ser: 0.84 mg/dL (ref 0.76–1.27)
GFR calc Af Amer: 106 mL/min/{1.73_m2} (ref >59)
GFR calc non Af Amer: 92 mL/min/{1.73_m2} (ref >59)
Glucose: 105 mg/dL — ABNORMAL HIGH (ref 65–99)
Potassium: 3.6 mmol/L (ref 3.5–5.2)
Sodium: 145 mmol/L — ABNORMAL HIGH (ref 134–144)

## 2016-12-02 ENCOUNTER — Telehealth: Payer: Self-pay

## 2016-12-02 NOTE — Telephone Encounter (Signed)
Left message for patient to call back for lab results

## 2016-12-02 NOTE — Telephone Encounter (Signed)
-----   Message from Rosalio MacadamiaLori C Gerhardt, NP sent at 12/01/2016  5:45 PM EDT ----- Ok to report. Repeat BMET is stable - would continue on current regimen.

## 2016-12-29 ENCOUNTER — Ambulatory Visit: Payer: Medicaid Other | Admitting: Internal Medicine

## 2017-01-03 ENCOUNTER — Encounter: Payer: Self-pay | Admitting: Internal Medicine

## 2017-02-19 ENCOUNTER — Other Ambulatory Visit: Payer: Self-pay | Admitting: Internal Medicine

## 2017-02-19 DIAGNOSIS — M1A09X0 Idiopathic chronic gout, multiple sites, without tophus (tophi): Secondary | ICD-10-CM

## 2017-07-14 ENCOUNTER — Other Ambulatory Visit: Payer: Self-pay | Admitting: Internal Medicine

## 2017-07-14 DIAGNOSIS — I10 Essential (primary) hypertension: Secondary | ICD-10-CM

## 2017-09-01 ENCOUNTER — Other Ambulatory Visit: Payer: Self-pay | Admitting: Internal Medicine

## 2017-09-01 DIAGNOSIS — M1A09X0 Idiopathic chronic gout, multiple sites, without tophus (tophi): Secondary | ICD-10-CM

## 2017-09-01 DIAGNOSIS — K219 Gastro-esophageal reflux disease without esophagitis: Secondary | ICD-10-CM

## 2018-01-10 ENCOUNTER — Other Ambulatory Visit: Payer: Self-pay | Admitting: Nurse Practitioner

## 2018-01-10 DIAGNOSIS — I2510 Atherosclerotic heart disease of native coronary artery without angina pectoris: Secondary | ICD-10-CM

## 2018-01-10 DIAGNOSIS — I10 Essential (primary) hypertension: Secondary | ICD-10-CM

## 2018-01-29 ENCOUNTER — Other Ambulatory Visit: Payer: Self-pay | Admitting: Internal Medicine

## 2018-01-29 DIAGNOSIS — M1A09X0 Idiopathic chronic gout, multiple sites, without tophus (tophi): Secondary | ICD-10-CM

## 2018-02-05 ENCOUNTER — Other Ambulatory Visit: Payer: Self-pay | Admitting: Nurse Practitioner

## 2018-02-05 DIAGNOSIS — I10 Essential (primary) hypertension: Secondary | ICD-10-CM

## 2018-02-05 DIAGNOSIS — I2510 Atherosclerotic heart disease of native coronary artery without angina pectoris: Secondary | ICD-10-CM

## 2018-02-23 ENCOUNTER — Other Ambulatory Visit: Payer: Self-pay | Admitting: *Deleted

## 2018-02-23 DIAGNOSIS — I10 Essential (primary) hypertension: Secondary | ICD-10-CM

## 2018-02-23 MED ORDER — LISINOPRIL 40 MG PO TABS: 40.0000 mg | ORAL_TABLET | Freq: Every day | ORAL | 2 refills | Status: DC

## 2018-03-16 ENCOUNTER — Other Ambulatory Visit: Payer: Self-pay | Admitting: Nurse Practitioner

## 2018-03-16 DIAGNOSIS — I2510 Atherosclerotic heart disease of native coronary artery without angina pectoris: Secondary | ICD-10-CM

## 2018-03-16 DIAGNOSIS — I10 Essential (primary) hypertension: Secondary | ICD-10-CM

## 2018-04-06 ENCOUNTER — Other Ambulatory Visit: Payer: Self-pay | Admitting: Nurse Practitioner

## 2018-04-06 DIAGNOSIS — I2510 Atherosclerotic heart disease of native coronary artery without angina pectoris: Secondary | ICD-10-CM

## 2018-04-06 DIAGNOSIS — I10 Essential (primary) hypertension: Secondary | ICD-10-CM

## 2018-04-10 ENCOUNTER — Other Ambulatory Visit: Payer: Self-pay | Admitting: Nurse Practitioner

## 2018-04-10 DIAGNOSIS — I10 Essential (primary) hypertension: Secondary | ICD-10-CM

## 2018-04-21 ENCOUNTER — Other Ambulatory Visit: Payer: Self-pay | Admitting: Nurse Practitioner

## 2018-04-21 DIAGNOSIS — I2510 Atherosclerotic heart disease of native coronary artery without angina pectoris: Secondary | ICD-10-CM

## 2018-04-24 ENCOUNTER — Other Ambulatory Visit: Payer: Self-pay | Admitting: Nurse Practitioner

## 2018-04-24 DIAGNOSIS — I2510 Atherosclerotic heart disease of native coronary artery without angina pectoris: Secondary | ICD-10-CM

## 2018-04-24 MED ORDER — ATORVASTATIN CALCIUM 40 MG PO TABS: 40.0000 mg | ORAL_TABLET | Freq: Every day | ORAL | 0 refills | Status: DC

## 2018-04-24 NOTE — Addendum Note (Signed)
Addended by: Demetrios LollBARNARD, CATHY C on: 04/24/2018 02:39 PM   Modules accepted: Orders

## 2018-05-23 ENCOUNTER — Other Ambulatory Visit: Payer: Self-pay | Admitting: Nurse Practitioner

## 2018-05-23 DIAGNOSIS — I10 Essential (primary) hypertension: Secondary | ICD-10-CM

## 2018-06-19 ENCOUNTER — Other Ambulatory Visit: Payer: Self-pay | Admitting: Nurse Practitioner

## 2018-06-19 DIAGNOSIS — I10 Essential (primary) hypertension: Secondary | ICD-10-CM

## 2018-06-20 ENCOUNTER — Other Ambulatory Visit: Payer: Self-pay | Admitting: Nurse Practitioner

## 2018-06-20 DIAGNOSIS — I10 Essential (primary) hypertension: Secondary | ICD-10-CM

## 2018-06-20 DIAGNOSIS — I2510 Atherosclerotic heart disease of native coronary artery without angina pectoris: Secondary | ICD-10-CM

## 2018-07-03 ENCOUNTER — Other Ambulatory Visit: Payer: Self-pay | Admitting: Nurse Practitioner

## 2018-07-03 DIAGNOSIS — I10 Essential (primary) hypertension: Secondary | ICD-10-CM

## 2018-07-03 DIAGNOSIS — I2510 Atherosclerotic heart disease of native coronary artery without angina pectoris: Secondary | ICD-10-CM

## 2018-07-17 ENCOUNTER — Other Ambulatory Visit: Payer: Self-pay | Admitting: Cardiovascular Disease

## 2018-07-17 DIAGNOSIS — I2510 Atherosclerotic heart disease of native coronary artery without angina pectoris: Secondary | ICD-10-CM

## 2018-07-17 DIAGNOSIS — I10 Essential (primary) hypertension: Secondary | ICD-10-CM

## 2018-07-17 MED ORDER — AMLODIPINE BESYLATE 10 MG PO TABS: 10.0000 mg | ORAL_TABLET | Freq: Every day | ORAL | 0 refills | Status: DC

## 2018-07-17 MED ORDER — ATORVASTATIN CALCIUM 40 MG PO TABS: 40.0000 mg | ORAL_TABLET | Freq: Every day | ORAL | 0 refills | Status: DC

## 2018-07-17 MED ORDER — CLOPIDOGREL BISULFATE 75 MG PO TABS: 75.0000 mg | ORAL_TABLET | Freq: Every day | ORAL | 0 refills | Status: DC

## 2018-07-17 MED ORDER — CARVEDILOL 6.25 MG PO TABS: 6.2500 mg | ORAL_TABLET | Freq: Two times a day (BID) | ORAL | 0 refills | Status: DC

## 2018-07-17 NOTE — Telephone Encounter (Signed)
Pt's medications were sent to pt's pharmacy as requested. colchicine was prescribed by PCP. Confirmation received.

## 2018-07-17 NOTE — Telephone Encounter (Signed)
New Message   *STAT* If patient is at the pharmacy, call can be transferred to refill team.   1. Which medications need to be refilled? (please list name of each medication and dose if known) Colchcine 0.6 mg    Carvedilol 6.25mg    Amlodipine 10mg   Atorvastatin 40mg    2. Which pharmacy/location (including street and city if local pharmacy) is medication to be sent to [facility] on [address]   3. Do they need a 30 day or 90 day supply? 90 days    PT is currently out of medication

## 2018-07-24 ENCOUNTER — Emergency Department (HOSPITAL_COMMUNITY)
Admission: EM | Admit: 2018-07-24 | Discharge: 2018-07-24 | Disposition: A | Discharge status: Home / Self Care | Payer: No Typology Code available for payment source | Attending: Emergency Medicine | Admitting: Emergency Medicine

## 2018-07-24 ENCOUNTER — Encounter (HOSPITAL_COMMUNITY): Payer: Self-pay

## 2018-07-24 ENCOUNTER — Other Ambulatory Visit: Payer: Self-pay

## 2018-07-24 DIAGNOSIS — M10021 Idiopathic gout, right elbow: Secondary | ICD-10-CM | POA: Insufficient documentation

## 2018-07-24 DIAGNOSIS — I252 Old myocardial infarction: Secondary | ICD-10-CM | POA: Diagnosis not present

## 2018-07-24 DIAGNOSIS — Z7902 Long term (current) use of antithrombotics/antiplatelets: Secondary | ICD-10-CM | POA: Diagnosis not present

## 2018-07-24 DIAGNOSIS — I2510 Atherosclerotic heart disease of native coronary artery without angina pectoris: Secondary | ICD-10-CM | POA: Diagnosis not present

## 2018-07-24 DIAGNOSIS — M109 Gout, unspecified: Secondary | ICD-10-CM

## 2018-07-24 DIAGNOSIS — Z955 Presence of coronary angioplasty implant and graft: Secondary | ICD-10-CM | POA: Insufficient documentation

## 2018-07-24 DIAGNOSIS — M25521 Pain in right elbow: Secondary | ICD-10-CM | POA: Diagnosis present

## 2018-07-24 DIAGNOSIS — Z79899 Other long term (current) drug therapy: Secondary | ICD-10-CM | POA: Insufficient documentation

## 2018-07-24 DIAGNOSIS — Z87891 Personal history of nicotine dependence: Secondary | ICD-10-CM | POA: Diagnosis not present

## 2018-07-24 DIAGNOSIS — I10 Essential (primary) hypertension: Secondary | ICD-10-CM | POA: Insufficient documentation

## 2018-07-24 DIAGNOSIS — Z7982 Long term (current) use of aspirin: Secondary | ICD-10-CM | POA: Diagnosis not present

## 2018-07-24 DIAGNOSIS — J449 Chronic obstructive pulmonary disease, unspecified: Secondary | ICD-10-CM | POA: Diagnosis not present

## 2018-07-24 MED ORDER — HYDROCODONE-ACETAMINOPHEN 5-325 MG PO TABS: 1.0000 | ORAL_TABLET | ORAL | 0 refills | Status: DC | PRN

## 2018-07-24 MED ORDER — PREDNISONE 20 MG PO TABS
60.0000 mg | ORAL_TABLET | Freq: Once | ORAL | Status: AC
  Administered 2018-07-24: 60 mg via ORAL
  Filled 2018-07-24: qty 3

## 2018-07-24 MED ORDER — PREDNISONE 20 MG PO TABS: 20.0000 mg | ORAL_TABLET | Freq: Two times a day (BID) | ORAL | 0 refills | Status: DC

## 2018-07-24 MED ORDER — OXYCODONE-ACETAMINOPHEN 5-325 MG PO TABS
1.0000 | ORAL_TABLET | Freq: Once | ORAL | Status: AC
  Administered 2018-07-24: 1 via ORAL
  Filled 2018-07-24: qty 1

## 2018-07-24 NOTE — ED Notes (Signed)
Wife loud with registration staff concerned with wait. This Clinical research associate reassessed pt affirming that we are doing best possible to work on getting a room. Pt calm, cooperative, NAD.

## 2018-07-24 NOTE — ED Notes (Signed)
Provider at bedside

## 2018-07-24 NOTE — ED Provider Notes (Addendum)
[facility]-EMERGENCY DEPT Provider Note   CSN: [id number] Arrival date & time: 07/24/18  1114    History   Chief Complaint Chief Complaint  Patient presents with  . Joint Swelling  . Fever    HPI [name] is a 68 y.o. male.     HPI   Patient complains of pain and right elbow and right wrist for 2 days.  Recently he has been out of his colchicine but filled the prescription, 2 days ago.  No known trauma.  He thinks that his gout is acting up.  Today he had a fever, which was treated with aspirin.  He denies recent cough, shortness of breath, change in bowel or urinary habits.  He has been able to walk without problems.  There are no other known modifying factors.  Past Medical History:  Diagnosis Date  . Angina   . COPD (chronic obstructive pulmonary disease) (HCC)   . Coronary artery disease    s/p NSTEMI 10/2007, RCA and LCx PCI with drug-eluting stents; patent stents cath 04/2011  . DJD (degenerative joint disease)   . Emphysema   . Gout   . Hypercholesterolemia   . Hypertension    Poorly controlled  . Myocardial infarction (HCC)   . Pneumonia   . Shortness of breath     Patient Active Problem List   Diagnosis Date Noted  . Abdominal pain 09/21/2016  . Spinal stenosis of lumbar region 09/30/2015  . Radiculopathy of lumbosacral region 09/30/2015  . Falls 09/30/2015  . Leg weakness, bilateral 09/30/2015  . Spasticity 09/30/2015  . Myelomalacia of cervical cord (HCC) 09/30/2015  . Gait abnormality 09/30/2015  . Abnormality of gait due to impairment of balance 09/11/2015  . Erectile dysfunction 06/28/2015  . GERD (gastroesophageal reflux disease) 06/27/2015  . Hyperlipemia 05/22/2014  . Abnormal TSH 05/22/2014  . Syncope and collapse 05/21/2014  . Syncope 05/21/2014  . Edema 05/13/2011  . Gout   . Hypokalemia 04/21/2011  . Pain, joint, ankle and foot 04/21/2011  . Chest pain 04/19/2011  . Hyperlipidemia 05/18/2008  .  HYPERTENSION, BENIGN 05/18/2008  . CAD, NATIVE VESSEL 10/17/2007    Past Surgical History:  Procedure Laterality Date  . CERVICAL DISC SURGERY    . CORONARY ANGIOPLASTY WITH STENT PLACEMENT    . LEFT HEART CATHETERIZATION WITH CORONARY ANGIOGRAM N/A 04/20/2011   Procedure: LEFT HEART CATHETERIZATION WITH CORONARY ANGIOGRAM;  Surgeon: Peter M Swaziland, MD;  Location: [facility] CATH LAB;  Service: Cardiovascular;  Laterality: N/A;        Home Medications    Prior to Admission medications   Medication Sig Start Date End Date Taking? Authorizing Provider  allopurinol (ZYLOPRIM) 100 MG tablet TAKE 1 TABLET (100 MG TOTAL) BY MOUTH DAILY. START TAKING 2 WEEKS AFTER GOUT FLARE RESOLVED. 09/01/17  Yes Marquette Saa, MD  amLODipine (NORVASC) 10 MG tablet Take 1 tablet (10 mg total) by mouth daily. Please keep upcoming appt in March before anymore refills. Thank you 07/17/18  Yes Tonny Bollman, MD  aspirin 81 MG tablet Take 81 mg by mouth daily.     Yes [provider]  atorvastatin (LIPITOR) 40 MG tablet Take 1 tablet (40 mg total) by mouth daily. Please keep upcoming appt in March before anymore refills. Thank you 07/17/18  Yes Tonny Bollman, MD  carvedilol (COREG) 6.25 MG tablet Take 1 tablet (6.25 mg total) by mouth 2 (two) times daily with a meal. Please keep upcoming appt in March before anymore  refills. Thank you 07/17/18  Yes Excell Seltzerooper, Casimiro NeedleMichael, MD  clopidogrel (PLAVIX) 75 MG tablet Take 1 tablet (75 mg total) by mouth daily. Please keep upcoming appt in March before anymore refills. Thank you 07/17/18  Yes Excell Seltzerooper, Casimiro NeedleMichael, MD  colchicine 0.6 MG tablet TAKE 1 TABLET (0.6 MG TOTAL) BY MOUTH DAILY. 01/30/18  Yes Myrene BuddyFletcher, Jacob, MD  levothyroxine (LEVOTHROID) 25 MCG tablet Take 1 tablet (25 mcg total) by mouth daily before breakfast. 05/23/14  Yes Hongalgi, Maximino GreenlandAnand D, MD  lisinopril (PRINIVIL,ZESTRIL) 40 MG tablet Take 1 tablet (40 mg total) by mouth daily. 02/23/18  Yes Myrene BuddyFletcher, Jacob, MD    pantoprazole (PROTONIX) 40 MG tablet TAKE 1 TABLET BY MOUTH EVERY DAY Patient taking differently: Take 40 mg by mouth daily.  09/01/17  Yes Marquette SaaLancaster, Abigail Joseph, MD  ranitidine (ZANTAC) 300 MG tablet Take 1 tablet (300 mg total) by mouth at bedtime. Patient taking differently: Take 300 mg by mouth daily.  09/21/16  Yes Marquette SaaLancaster, Abigail Joseph, MD  HYDROcodone-acetaminophen [facility](NORCO) 5-325 MG tablet Take 1 tablet by mouth every 4 (four) hours as needed for moderate pain. 07/24/18   Mancel BaleWentz, Maggie Senseney, MD  nitroGLYCERIN (NITROSTAT) 0.4 MG SL tablet Place 1 tablet (0.4 mg total) under the tongue every 5 (five) minutes x 3 doses as needed. For shortness of breath 11/08/16   Rosalio MacadamiaGerhardt, Lori C, NP  predniSONE (DELTASONE) 20 MG tablet Take 1 tablet (20 mg total) by mouth 2 (two) times daily. 07/24/18   Mancel BaleWentz, Sherrell Farish, MD  tadalafil (CIALIS) 20 MG tablet Take 0.5-1 tablets (10-20 mg total) by mouth every other day as needed for erectile dysfunction. Do NOT take Nitroglycerin with this med. Patient not taking: Reported on 07/24/2018 09/21/16   Marquette SaaLancaster, Abigail Joseph, MD  traMADol Janean Sark(ULTRAM) 50 MG tablet Take 1-2 tablets every 4-6 hours Patient not taking: Reported on 07/24/2018 09/30/15   Anson FretAhern, Antonia B, MD    Family History Family History  Problem Relation Age of Onset  . Heart attack Mother [id number]       Deceased at 4845 from MI  . Pneumonia Father   . Hypertension Father   . Hypertension Sister   . Hypertension Sister     Social History Social History   Tobacco Use  . Smoking status: Former Smoker    Packs/day: 1.00    Years: 30.00    Pack years: 30.00    Types: Cigarettes    Last attempt to quit: 05/17/2005    Years since quitting: 13.1  . Smokeless tobacco: Never Used  Substance Use Topics  . Alcohol use: Yes    Alcohol/week: 3.0 standard drinks    Types: 3 Standard drinks or equivalent per week    Comment: occasionally  . Drug use: No     Allergies   Patient has no known  allergies.   Review of Systems Review of Systems  All other systems reviewed and are negative.    Physical Exam Updated Vital Signs BP 129/73 (BP Location: Left Arm)   Pulse 70   Temp 99.1 F (37.3 C) (Oral)   Resp 18   Ht 6\' 1"  (1.854 m)   Wt 77.1 kg   SpO2 99%   BMI 22.43 kg/m   Physical Exam Vitals signs and nursing note reviewed.  Constitutional:      General: He is not in acute distress.    Appearance: Normal appearance. He is well-developed and normal weight. He is not ill-appearing, toxic-appearing or diaphoretic.  HENT:     Head:  Normocephalic and atraumatic.     Right Ear: External ear normal.     Left Ear: External ear normal.  Eyes:     Conjunctiva/sclera: Conjunctivae normal.     Pupils: Pupils are equal, round, and reactive to light.  Neck:     Musculoskeletal: Normal range of motion and neck supple.     Trachea: Phonation normal.  Cardiovascular:     Rate and Rhythm: Normal rate and regular rhythm.     Heart sounds: Normal heart sounds.  Pulmonary:     Effort: Pulmonary effort is normal.     Breath sounds: Normal breath sounds.  Musculoskeletal:     Comments: Right elbow is tender and mildly swollen with some slight erythema.  He resists motion of the right elbow secondary to pain.  Right wrist is tender without swelling or deformity.  Skin:    General: Skin is warm and dry.  Neurological:     Mental Status: He is alert and oriented to person, place, and time.     Cranial Nerves: No cranial nerve deficit.     Sensory: No sensory deficit.     Motor: No abnormal muscle tone.     Coordination: Coordination normal.  Psychiatric:        Mood and Affect: Mood normal.        Behavior: Behavior normal.        Thought Content: Thought content normal.        Judgment: Judgment normal.      ED Treatments / Results  Labs (all labs ordered are listed, but only abnormal results are displayed) Labs Reviewed - No data to  display  EKG None  Radiology No results found.  Procedures Procedures (including critical care time)  Medications Ordered in ED Medications  oxyCODONE-acetaminophen (PERCOCET/ROXICET) 5-325 MG per tablet 1 tablet (1 tablet Oral Given 07/24/18 1530)  predniSONE (DELTASONE) tablet 60 mg (60 mg Oral Given 07/24/18 1530)     Initial Impression / Assessment and Plan / ED Course  I have reviewed the triage vital signs and the nursing notes.  Pertinent labs & imaging results that were available during my care of the patient were reviewed by me and considered in my medical decision making (see chart for details).         Patient Vitals for the past 24 hrs:  BP Temp Temp src Pulse Resp SpO2 Height Weight  07/24/18 1924 129/73 - - 70 18 99 % - -  07/24/18 1434 (!) 115/97 99.1 F (37.3 C) Oral 78 18 97 % - -  07/24/18 1140 116/69 98.7 F (37.1 C) Oral 78 16 95 %  (1.854 m) 77.1 kg    7:30 PM Reevaluation with update and discussion. After initial assessment and treatment, an updated evaluation reveals he states that his pain is better he can now move his elbow and right hand, much better.  Findings discussed with the patient all questions were answered. Mancel Bale   Medical Decision Making: Evaluation consistent with arthritis likely gout, without signs of septic arthritis.  Improved after treatment stable for discharge.  He was unable to afford his allopurinol, as of expense.  CRITICAL CARE-no Performed by: Mancel Bale   Nursing Notes Reviewed/ Care Coordinated Applicable Imaging Reviewed Interpretation of Laboratory Data incorporated into ED treatment  The patient appears reasonably screened and/or stabilized for discharge and I doubt any other medical condition or other [facility] requiring further screening, evaluation, or treatment in the ED at this time prior  to discharge.  Plan: Home Medications-continue usual; Home Treatments-heat to affected area; return here if the  recommended treatment, does not improve the symptoms; Recommended follow up-PCP, PRN    Final Clinical Impressions(s) / ED Diagnoses   Final diagnoses:  Gout of right elbow, unspecified cause, unspecified chronicity    ED Discharge Orders         Ordered    HYDROcodone-acetaminophen (NORCO) 5-325 MG tablet  Every 4 hours PRN     07/24/18 1928    predniSONE (DELTASONE) 20 MG tablet  2 times daily     07/24/18 1928           Mancel Bale, MD 07/24/18 1931    Mancel Bale, MD 07/24/18 2142

## 2018-07-24 NOTE — Discharge Instructions (Addendum)
Use heat on the sore area 3 or 4 times a day. ° °See your doctor as needed for problems. °

## 2018-07-24 NOTE — ED Triage Notes (Signed)
Patient c/o bilateral joint swelling of both hands. Patient reports a history of gout. Patient states he has not had his usual gout medication due to cost. Patient states he has been having a intermittent fever at home and his significant other gave Aspirin for a temp at home of 101.0 prior to coming to the ED.

## 2018-07-26 NOTE — Progress Notes (Signed)
Cardiology Office Note    Date:  07/27/2018   ID:  Sharbel, Cioffi 04-24-1951, MRN [id number]  PCP:  Myrene Buddy, MD  Cardiologist:  Dr. Excell Seltzer  Chief Complaint: 21 Months follow up  History of Present Illness:   [name] is a 68 y.o. male with hx of CAD, HTN, HLD and COPD presents for follow up.   he initially presented in 2009 with a non-ST elevation infarction. He was treated with multivessel stenting of the left circumflex and right coronary arteries. He returned in 2012 with chest pain and underwent repeat cardiac catheterization. His stents were patent and he had mild to moderate diffuse disease elsewhere. Medical therapy was recommended.  Dr. Excell Seltzer recommended continued long term use of his Plavix in light of his multiple stents.   He was doing well when last seen by APP 11/08/2016.  Here today for follow up.  He is not use cane for walking.  The patient denies nausea, vomiting, fever, chest pain, palpitations, shortness of breath, orthopnea, PND, dizziness, syncope, cough, congestion, abdominal pain, hematochezia, melena, lower extremity edema. Past Medical History:  Diagnosis Date  . Angina   . COPD (chronic obstructive pulmonary disease) (HCC)   . Coronary artery disease    s/p NSTEMI 10/2007, RCA and LCx PCI with drug-eluting stents; patent stents cath 04/2011  . DJD (degenerative joint disease)   . Emphysema   . Gout   . Hypercholesterolemia   . Hypertension    Poorly controlled  . Myocardial infarction (HCC)   . Pneumonia   . Shortness of breath     Past Surgical History:  Procedure Laterality Date  . CERVICAL DISC SURGERY    . CORONARY ANGIOPLASTY WITH STENT PLACEMENT    . LEFT HEART CATHETERIZATION WITH CORONARY ANGIOGRAM N/A 04/20/2011   Procedure: LEFT HEART CATHETERIZATION WITH CORONARY ANGIOGRAM;  Surgeon: Peter M Swaziland, MD;  Location: [facility] CATH LAB;  Service: Cardiovascular;  Laterality: N/A;    Current Medications: Prior to  Admission medications   Medication Sig Start Date End Date Taking? Authorizing Provider  allopurinol (ZYLOPRIM) 100 MG tablet TAKE 1 TABLET (100 MG TOTAL) BY MOUTH DAILY. START TAKING 2 WEEKS AFTER GOUT FLARE RESOLVED. 09/01/17   Marquette Saa, MD  amLODipine (NORVASC) 10 MG tablet Take 1 tablet (10 mg total) by mouth daily. Please keep upcoming appt in March before anymore refills. Thank you 07/17/18   Tonny Bollman, MD  aspirin 81 MG tablet Take 81 mg by mouth daily.      [provider]  atorvastatin (LIPITOR) 40 MG tablet Take 1 tablet (40 mg total) by mouth daily. Please keep upcoming appt in March before anymore refills. Thank you 07/17/18   Tonny Bollman, MD  carvedilol (COREG) 6.25 MG tablet Take 1 tablet (6.25 mg total) by mouth 2 (two) times daily with a meal. Please keep upcoming appt in March before anymore refills. Thank you 07/17/18   Tonny Bollman, MD  clopidogrel (PLAVIX) 75 MG tablet Take 1 tablet (75 mg total) by mouth daily. Please keep upcoming appt in March before anymore refills. Thank you 07/17/18   Tonny Bollman, MD  colchicine 0.6 MG tablet TAKE 1 TABLET (0.6 MG TOTAL) BY MOUTH DAILY. 01/30/18   Myrene Buddy, MD  HYDROcodone-acetaminophen (NORCO) 5-325 MG tablet Take 1 tablet by mouth every 4 (four) hours as needed for moderate pain. 07/24/18   Mancel Bale, MD  levothyroxine (LEVOTHROID) 25 MCG tablet Take 1 tablet (25 mcg total) by mouth  daily before breakfast. 05/23/14   Hongalgi, Maximino Greenland, MD  lisinopril (PRINIVIL,ZESTRIL) 40 MG tablet Take 1 tablet (40 mg total) by mouth daily. 02/23/18   Myrene Buddy, MD  nitroGLYCERIN (NITROSTAT) 0.4 MG SL tablet Place 1 tablet (0.4 mg total) under the tongue every 5 (five) minutes x 3 doses as needed. For shortness of breath 11/08/16   Rosalio Macadamia, NP  pantoprazole (PROTONIX) 40 MG tablet TAKE 1 TABLET BY MOUTH EVERY DAY Patient taking differently: Take 40 mg by mouth daily.  09/01/17   Marquette Saa, MD  predniSONE (DELTASONE) 20 MG tablet Take 1 tablet (20 mg total) by mouth 2 (two) times daily. 07/24/18   Mancel Bale, MD  ranitidine (ZANTAC) 300 MG tablet Take 1 tablet (300 mg total) by mouth at bedtime. Patient taking differently: Take 300 mg by mouth daily.  09/21/16   Marquette Saa, MD  tadalafil (CIALIS) 20 MG tablet Take 0.5-1 tablets (10-20 mg total) by mouth every other day as needed for erectile dysfunction. Do NOT take Nitroglycerin with this med. Patient not taking: Reported on 07/24/2018 09/21/16   Marquette Saa, MD  traMADol Janean Sark) 50 MG tablet Take 1-2 tablets every 4-6 hours Patient not taking: Reported on 07/24/2018 09/30/15   Anson Fret, MD    Allergies:   Patient has no known allergies.   Social History   Socioeconomic History  . Marital status: Married    Spouse name: Not on file  . Number of children: 4  . Years of education: Not on file  . Highest education level: Not on file  Occupational History  . Occupation: Disabled  Social Needs  . Financial resource strain: Not on file  . Food insecurity:    Worry: Not on file    Inability: Not on file  . Transportation needs:    Medical: Not on file    Non-medical: Not on file  Tobacco Use  . Smoking status: Former Smoker    Packs/day: 1.00    Years: 30.00    Pack years: 30.00    Types: Cigarettes    Last attempt to quit: 05/17/2005    Years since quitting: 13.2  . Smokeless tobacco: Never Used  Substance and Sexual Activity  . Alcohol use: Yes    Alcohol/week: 3.0 standard drinks    Types: 3 Standard drinks or equivalent per week    Comment: occasionally  . Drug use: No  . Sexual activity: Yes  Lifestyle  . Physical activity:    Days per week: Not on file    Minutes per session: Not on file  . Stress: Not on file  Relationships  . Social connections:    Talks on phone: Not on file    Gets together: Not on file    Attends religious service: Not on file    Active  member of club or organization: Not on file    Attends meetings of clubs or organizations: Not on file    Relationship status: Not on file  Other Topics Concern  . Not on file  Social History Narrative   Disabled veteran, lives in [place] with his wife      Previous smoker - quit 2007      Previous Etoh and marijuana, quit 2009     Family History:  The patient's family history includes Heart attack (age of onset: 28) in his mother; Hypertension in his father, sister, and sister; Pneumonia in his father.   ROS:  Please see the history of present illness.    ROS All other systems reviewed and are negative.   PHYSICAL EXAM:   VS:  BP 112/72   Pulse 74   Wt 163 lb 1.6 oz (74 kg)   BMI 21.52 kg/m    GEN: Well nourished, well developed, in no acute distress  HEENT: normal  Neck: no JVD, carotid bruits, or masses Cardiac: RRR; no murmurs, rubs, or gallops,no edema  Respiratory:  clear to auscultation bilaterally, normal work of breathing GI: soft, nontender, nondistended, + BS MS: no deformity or atrophy  Skin: warm and dry, no rash Neuro:  Alert and Oriented x 3, Strength and sensation are intact Psych: euthymic mood, full affect  Wt Readings from Last 3 Encounters:  07/27/18 163 lb 1.6 oz (74 kg)  07/24/18 170 lb (77.1 kg)  11/08/16 167 lb 6.4 oz (75.9 kg)      Studies/Labs Reviewed:   EKG:  EKG is ordered today.  The ekg ordered today demonstrates NSR at rate of 74 bpm  Recent Labs: No results found for requested labs within last 8760 hours.   Lipid Panel    Component Value Date/Time   CHOL 136 11/08/2016 1415   TRIG 223 (H) 11/08/2016 1415   HDL 60 11/08/2016 1415   CHOLHDL 2.3 11/08/2016 1415   CHOLHDL 2.3 07/28/2015 0829   VLDL 28 07/28/2015 0829   LDLCALC 31 11/08/2016 1415   LDLDIRECT 125.6 01/31/2013 1149    Additional studies/ records that were reviewed today include:   Echocardiogram: 05/2014 Study Conclusions  - Left ventricle:  The cavity size was normal. Systolic function was normal. The estimated ejection fraction was in the range of 60% to 65%. Wall motion was normal; there were no regional wall motion abnormalities. Left ventricular diastolic function parameters were normal.    ASSESSMENT & PLAN:    1. CAD  - long term DAPT recommended given multiple stent.  Compliant.  No angina.  Continue statin and beta-blocker.  2. HTN -Blood pressure stable on current medication.    3. HLD -Continue statin.  He will follow-up with PCP for lab.  Follow-up with Dr. Excell Seltzer in 1 year. No change in medications.     Medication Adjustments/Labs and Tests Ordered: Current medicines are reviewed at length with the patient today.  Concerns regarding medicines are outlined above.  Medication changes, Labs and Tests ordered today are listed in the Patient Instructions below. There are no Patient Instructions on file for this visit.   Lorelei Pont, [place]  07/27/2018 10:24 AM    [facility] [address] [place], [place], [place]  [address] Phone: [phone]; Fax: [phone]

## 2018-07-27 ENCOUNTER — Ambulatory Visit (INDEPENDENT_AMBULATORY_CARE_PROVIDER_SITE_OTHER): Payer: No Typology Code available for payment source | Admitting: Physician Assistant

## 2018-07-27 ENCOUNTER — Other Ambulatory Visit: Payer: Self-pay

## 2018-07-27 VITALS — BP 112/72 | HR 74 | Wt 163.1 lb

## 2018-07-27 DIAGNOSIS — I2510 Atherosclerotic heart disease of native coronary artery without angina pectoris: Secondary | ICD-10-CM

## 2018-07-27 DIAGNOSIS — I10 Essential (primary) hypertension: Secondary | ICD-10-CM | POA: Diagnosis not present

## 2018-07-27 NOTE — Patient Instructions (Signed)
Medication Instructions:   Your physician recommends that you continue on your current medications as directed. Please refer to the Current Medication list given to you today.  If you need a refill on your cardiac medications before your next appointment, please call your pharmacy.   Lab work: NONE ORDERED  TODAY   If you have labs (blood work) drawn today and your tests are completely normal, you will receive your results only by: Marland Kitchen MyChart Message (if you have MyChart) OR . A paper copy in the mail If you have any lab test that is abnormal or we need to change your treatment, we will call you to review the results.  Testing/Procedures: NONE ORDERED  TODAY   Follow-Up: At [facility], you and your health needs are our priority.  As part of our continuing mission to provide you with exceptional heart care, we have created designated Provider Care Teams.  These Care Teams include your primary Cardiologist (physician) and Advanced Practice Providers (APPs -  Physician Assistants and Nurse Practitioners) who all work together to provide you with the care you need, when you need it. You will need a follow up appointment in:  1 years.  Please call our office 2 months in advance to schedule this appointment.  You may see Dr. Excell Seltzer or one of the following Advanced Practice Providers on your designated Care Team: Tereso Newcomer, PA-C [place], [place] . Berton Bon, NP  Any Other Special Instructions Will Be Listed Below (If Applicable).

## 2018-08-14 ENCOUNTER — Other Ambulatory Visit: Payer: Self-pay | Admitting: Cardiovascular Disease

## 2018-08-14 DIAGNOSIS — I10 Essential (primary) hypertension: Secondary | ICD-10-CM

## 2018-08-14 DIAGNOSIS — I2510 Atherosclerotic heart disease of native coronary artery without angina pectoris: Secondary | ICD-10-CM

## 2018-08-18 ENCOUNTER — Telehealth: Payer: Self-pay

## 2018-08-18 DIAGNOSIS — M1A09X0 Idiopathic chronic gout, multiple sites, without tophus (tophi): Secondary | ICD-10-CM

## 2018-08-18 MED ORDER — COLCHICINE 0.6 MG PO CAPS: 0.6000 mg | ORAL_CAPSULE | Freq: Every day | ORAL | 0 refills | Status: DC

## 2018-08-18 NOTE — Telephone Encounter (Signed)
Pts wife called nurse line stating Colchicine needs to be called into the pharmacy in capsules and not tablets so insurance will pay. Please advise.

## 2018-08-18 NOTE — Telephone Encounter (Signed)
Changed colchicine to 0.6mg  caps and updated to 90day supply. Sent to [facility] on [address].  Myrene Buddy MD PGY-2 Family Medicine Resident

## 2018-08-18 NOTE — Addendum Note (Signed)
Addended by: Delman Cheadle on: 08/18/2018 07:46 PM   Modules accepted: Orders

## 2018-09-18 ENCOUNTER — Other Ambulatory Visit: Payer: Self-pay

## 2018-09-18 DIAGNOSIS — M1A09X0 Idiopathic chronic gout, multiple sites, without tophus (tophi): Secondary | ICD-10-CM

## 2018-09-18 MED ORDER — ALLOPURINOL 100 MG PO TABS: 100.0000 mg | ORAL_TABLET | Freq: Every day | ORAL | 3 refills | Status: AC

## 2018-11-13 ENCOUNTER — Other Ambulatory Visit: Payer: Self-pay | Admitting: *Deleted

## 2018-11-13 MED ORDER — COLCHICINE 0.6 MG PO CAPS: 1.0000 | ORAL_CAPSULE | Freq: Once | ORAL | 3 refills | Status: DC

## 2018-11-14 ENCOUNTER — Other Ambulatory Visit: Payer: Self-pay | Admitting: Nurse Practitioner

## 2018-11-14 DIAGNOSIS — I2510 Atherosclerotic heart disease of native coronary artery without angina pectoris: Secondary | ICD-10-CM

## 2018-11-15 ENCOUNTER — Other Ambulatory Visit: Payer: Self-pay | Admitting: Physician Assistant

## 2018-11-15 DIAGNOSIS — I10 Essential (primary) hypertension: Secondary | ICD-10-CM

## 2018-11-15 MED ORDER — AMLODIPINE BESYLATE 10 MG PO TABS: 10.0000 mg | ORAL_TABLET | Freq: Every day | ORAL | 2 refills | Status: DC

## 2019-02-25 ENCOUNTER — Other Ambulatory Visit: Payer: Self-pay | Admitting: Family Medicine

## 2019-02-25 DIAGNOSIS — I10 Essential (primary) hypertension: Secondary | ICD-10-CM

## 2019-06-26 ENCOUNTER — Other Ambulatory Visit: Payer: Self-pay | Admitting: *Deleted

## 2019-06-26 DIAGNOSIS — I10 Essential (primary) hypertension: Secondary | ICD-10-CM

## 2019-06-26 MED ORDER — LISINOPRIL 40 MG PO TABS: 40.0000 mg | ORAL_TABLET | Freq: Every day | ORAL | 2 refills | Status: AC

## 2019-06-27 ENCOUNTER — Other Ambulatory Visit: Payer: Self-pay

## 2019-06-27 MED ORDER — COLCHICINE 0.6 MG PO CAPS: 1.0000 | ORAL_CAPSULE | Freq: Once | ORAL | 3 refills | Status: DC

## 2019-07-02 ENCOUNTER — Other Ambulatory Visit: Payer: Self-pay | Admitting: Family Medicine

## 2019-07-02 ENCOUNTER — Telehealth: Payer: Self-pay

## 2019-07-02 MED ORDER — COLCHICINE 0.6 MG PO CAPS: 1.0000 | ORAL_CAPSULE | Freq: Once | ORAL | 3 refills | Status: AC

## 2019-07-02 NOTE — Progress Notes (Signed)
Changed to daily dosing of colchicine  Myrene Buddy MD PGY-3 Family Medicine Resident

## 2019-07-02 NOTE — Telephone Encounter (Signed)
Pharmacy calling regarding clarification on recent refill of colchicine 0.6 mg. Refill instructions state to Take 1 capsule by mouth once for 1 dose. Dr. Primitivo Gauze paged to verify instructions.   Per Dr. Primitivo Gauze, patient is to take this medication daily. Pharmacy informed and will dispense medication.   Veronda Prude, RN

## 2019-07-02 NOTE — Telephone Encounter (Signed)
Clarified dosing to daily per preceding telephone visit  Myrene Buddy MD PGY-3 Family Medicine Resident

## 2019-10-12 ENCOUNTER — Ambulatory Visit (INDEPENDENT_AMBULATORY_CARE_PROVIDER_SITE_OTHER): Payer: Medicare Other | Admitting: Cardiovascular Disease

## 2019-10-12 ENCOUNTER — Other Ambulatory Visit: Payer: Self-pay

## 2019-10-12 ENCOUNTER — Encounter: Payer: Self-pay | Admitting: Cardiovascular Disease

## 2019-10-12 ENCOUNTER — Telehealth: Payer: Self-pay | Admitting: Cardiovascular Disease

## 2019-10-12 VITALS — BP 134/72 | HR 81 | Ht 73.0 in | Wt 171.0 lb

## 2019-10-12 DIAGNOSIS — I2510 Atherosclerotic heart disease of native coronary artery without angina pectoris: Secondary | ICD-10-CM | POA: Diagnosis not present

## 2019-10-12 DIAGNOSIS — I10 Essential (primary) hypertension: Secondary | ICD-10-CM

## 2019-10-12 DIAGNOSIS — E782 Mixed hyperlipidemia: Secondary | ICD-10-CM

## 2019-10-12 MED ORDER — CLOPIDOGREL BISULFATE 75 MG PO TABS: 75.0000 mg | ORAL_TABLET | Freq: Every day | ORAL | 3 refills | Status: DC

## 2019-10-12 NOTE — Progress Notes (Signed)
Cardiology Office Note:    Date:  10/12/2019   ID:  [name] 1951-02-06, MRN [id number]  PCP:  Vladimir Crofts, FNP  Cardiologist:  Tonny Bollman, MD  Electrophysiologist:  None   Referring MD: Myrene Buddy, MD   Chief Complaint  Patient presents with  . Coronary Artery Disease    History of Present Illness:    [name] is a 69 y.o. male with a hx of coronary artery disease, presenting for follow-up evaluation.  The patient presented with non-STEMI in 2009 and was treated with PCI of the left circumflex and right coronary arteries with multiple DES placed.  He has had no recurrent ischemic events.  Has been maintained on long-term dual antiplatelet therapy with aspirin and clopidogrel in the setting of multiple stents.  Comorbid conditions include hypertension, mixed hyperlipidemia, and COPD.  He quit smoking over a decade ago.  The patient is here alone today.  He is ambulating with a cane due to problems with his back.  He has not had any cardiac-related symptoms and specifically denies chest pain, chest pressure, or shortness of breath.  He denies edema, orthopnea, or PND.  Past Medical History:  Diagnosis Date  . Angina   . COPD (chronic obstructive pulmonary disease) (HCC)   . Coronary artery disease    s/p NSTEMI 10/2007, RCA and LCx PCI with drug-eluting stents; patent stents cath 04/2011  . DJD (degenerative joint disease)   . Emphysema   . Gout   . Hypercholesterolemia   . Hypertension    Poorly controlled  . Myocardial infarction (HCC)   . Pneumonia   . Shortness of breath     Past Surgical History:  Procedure Laterality Date  . CERVICAL DISC SURGERY    . CORONARY ANGIOPLASTY WITH STENT PLACEMENT    . LEFT HEART CATHETERIZATION WITH CORONARY ANGIOGRAM N/A 04/20/2011   Procedure: LEFT HEART CATHETERIZATION WITH CORONARY ANGIOGRAM;  Surgeon: Peter M Swaziland, MD;  Location: [facility] CATH LAB;  Service: Cardiovascular;  Laterality: N/A;    Current  Medications: Current Meds  Medication Sig  . allopurinol (ZYLOPRIM) 100 MG tablet Take 1 tablet (100 mg total) by mouth daily. Start taking 2 weeks after gout flare resolved.  Marland Kitchen amLODipine (NORVASC) 10 MG tablet Take 1 tablet (10 mg total) by mouth daily.  Marland Kitchen aspirin 81 MG tablet Take 81 mg by mouth daily.    Marland Kitchen atorvastatin (LIPITOR) 40 MG tablet TAKE 1 TABLET BY MOUTH EVERY DAY  . carvedilol (COREG) 6.25 MG tablet Take 1 tablet (6.25 mg total) by mouth 2 (two) times daily with a meal.  . clopidogrel (PLAVIX) 75 MG tablet Take 1 tablet (75 mg total) by mouth daily.  . Colchicine (MITIGARE) 0.6 MG CAPS Take 1 capsule by mouth once for 1 dose.  Marland Kitchen HYDROcodone-acetaminophen (NORCO) 5-325 MG tablet Take 1 tablet by mouth every 4 (four) hours as needed for moderate pain.  Marland Kitchen levothyroxine (LEVOTHROID) 25 MCG tablet Take 1 tablet (25 mcg total) by mouth daily before breakfast.  . lisinopril (ZESTRIL) 40 MG tablet Take 1 tablet (40 mg total) by mouth daily.  . nitroGLYCERIN (NITROSTAT) 0.4 MG SL tablet Place 1 tablet (0.4 mg total) under the tongue every 5 (five) minutes x 3 doses as needed. For shortness of breath  . pantoprazole (PROTONIX) 40 MG tablet TAKE 1 TABLET BY MOUTH EVERY DAY (Patient taking differently: Take 40 mg by mouth daily. )  . [DISCONTINUED] clopidogrel (PLAVIX) 75 MG tablet Take 1  tablet (75 mg total) by mouth daily.     Allergies:   Patient has no known allergies.   Social History   Socioeconomic History  . Marital status: Married    Spouse name: Not on file  . Number of children: 4  . Years of education: Not on file  . Highest education level: Not on file  Occupational History  . Occupation: Disabled  Tobacco Use  . Smoking status: Former Smoker    Packs/day: 1.00    Years: 30.00    Pack years: 30.00    Types: Cigarettes    Quit date: 05/17/2005    Years since quitting: 14.4  . Smokeless tobacco: Never Used  Substance and Sexual Activity  . Alcohol use: Yes     Alcohol/week: 3.0 standard drinks    Types: 3 Standard drinks or equivalent per week    Comment: occasionally  . Drug use: No  . Sexual activity: Yes  Other Topics Concern  . Not on file  Social History Narrative   Disabled veteran, lives in [place] with his wife      Previous smoker - quit 2007      Previous Etoh and marijuana, quit 2009   Social Determinants of Health   Financial Resource Strain:   . Difficulty of Paying Living Expenses:   Food Insecurity:   . Worried About Programme researcher, broadcasting/film/video in the Last Year:   . Barista in the Last Year:   Transportation Needs:   . Freight forwarder (Medical):   Marland Kitchen Lack of Transportation (Non-Medical):   Physical Activity:   . Days of Exercise per Week:   . Minutes of Exercise per Session:   Stress:   . Feeling of Stress :   Social Connections:   . Frequency of Communication with Friends and Family:   . Frequency of Social Gatherings with Friends and Family:   . Attends Religious Services:   . Active Member of Clubs or Organizations:   . Attends Banker Meetings:   Marland Kitchen Marital Status:      Family History: The patient's family history includes Heart attack (age of onset: 45) in his mother; Hypertension in his father, sister, and sister; Pneumonia in his father.  ROS:   Please see the history of present illness.    Back pain, leg weakness. All other systems reviewed and are negative.  EKGs/Labs/Other Studies Reviewed:    EKG:  EKG is not ordered today.   Recent Labs: No results found for requested labs within last 8760 hours.  Recent Lipid Panel    Component Value Date/Time   CHOL 136 11/08/2016 1415   TRIG 223 (H) 11/08/2016 1415   HDL 60 11/08/2016 1415   CHOLHDL 2.3 11/08/2016 1415   CHOLHDL 2.3 07/28/2015 0829   VLDL 28 07/28/2015 0829   LDLCALC 31 11/08/2016 1415   LDLDIRECT 125.6 01/31/2013 1149    Physical Exam:    VS:  BP 134/72   Pulse 81   Ht 6\' 1"  (1.854 m)   Wt  171 lb (77.6 kg)   SpO2 (!) 81%   BMI 22.56 kg/m     Wt Readings from Last 3 Encounters:  10/12/19 171 lb (77.6 kg)  07/27/18 163 lb 1.6 oz (74 kg)  07/24/18 170 lb (77.1 kg)     GEN:  Well nourished, well developed in no acute distress HEENT: Normal NECK: No JVD; No carotid bruits LYMPHATICS: No lymphadenopathy CARDIAC: RRR, no murmurs,  rubs, gallops RESPIRATORY:  Clear to auscultation without rales, wheezing or rhonchi  ABDOMEN: Soft, non-tender, non-distended MUSCULOSKELETAL:  Legs thin, No edema; No deformity  SKIN: Warm and dry NEUROLOGIC:  Alert and oriented x 3 PSYCHIATRIC:  Normal affect   ASSESSMENT:    1. Essential hypertension   2. Atherosclerosis of native coronary artery of native heart without angina pectoris   3. Mixed hyperlipidemia    PLAN:    In order of problems listed above:  1. Stable without any symptoms of angina.  Somewhat low functional capacity because of his back and leg problems.  Continue aspirin and clopidogrel, high intensity statin drug, and antihypertensive medical therapy.  Patient states that an echo was recently done at his primary care office.  We will send for records of this. 2. As above, no medicine changes were made today.  He is appropriately treated with an ACE inhibitor, beta-blocker, aspirin, clopidogrel, amlodipine, and a high intensity statin drug. 3. Treated with atorvastatin 40 mg daily.  Will send for labs which were recently drawn by his primary care physician.  Goal LDL cholesterol is less than 70 mg/dL.   Medication Adjustments/Labs and Tests Ordered: Current medicines are reviewed at length with the patient today.  Concerns regarding medicines are outlined above.  No orders of the defined types were placed in this encounter.  Meds ordered this encounter  Medications  . clopidogrel (PLAVIX) 75 MG tablet    Sig: Take 1 tablet (75 mg total) by mouth daily.    Dispense:  90 tablet    Refill:  3    Patient Instructions   Medication Instructions:  Your provider recommends that you continue on your current medications as directed. Please refer to the Current Medication list given to you today.   *If you need a refill on your cardiac medications before your next appointment, please call your pharmacy*  Follow-Up: At [facility], you and your health needs are our priority.  As part of our continuing mission to provide you with exceptional heart care, we have created designated Provider Care Teams.  These Care Teams include your primary Cardiologist (physician) and Advanced Practice Providers (APPs -  Physician Assistants and Nurse Practitioners) who all work together to provide you with the care you need, when you need it. Your next appointment:   12 month(s) The format for your next appointment:   In Person Provider:   You may see Sherren Mocha, MD or one of the following Advanced Practice Providers on your designated Care Team:    Richardson Dopp, PA-C  Robbie Lis, [place]      Signed, Sherren Mocha, MD  10/12/2019 1:14 PM    [place]

## 2019-10-12 NOTE — Telephone Encounter (Signed)
Medical records requested from [facility]. 10/12/19 vlm

## 2019-10-12 NOTE — Patient Instructions (Signed)

## 2019-10-17 ENCOUNTER — Other Ambulatory Visit: Payer: Self-pay

## 2019-10-17 NOTE — Telephone Encounter (Signed)
Attempted to call patient. Received message he has not yet set up VM - unable to leave message.  Will try again later.

## 2019-10-17 NOTE — Telephone Encounter (Signed)
Pt calling requesting a refill on tadalafil. This medication was D/C off of pt's medication list. Pt stated that he told them that he was not taking this medication because of the cost, but pt would like for Dr. Excell Seltzer to send a new Rx for the generic, so pt can afford this medication. Pt would like a call back concerning this matter. Please address

## 2019-10-26 MED ORDER — TADALAFIL 10 MG PO TABS: 10.0000 mg | ORAL_TABLET | Freq: Every day | ORAL | 6 refills | Status: DC | PRN

## 2019-10-26 NOTE — Telephone Encounter (Signed)
I spoke with patient. He has taken prn Cialis 10 mg  in the past and would like refill for generic of this. It is not on his current medication list. Will forward to Dr Excell Seltzer to see if OK to refill. Patient uses [facility] on [address]

## 2019-10-26 NOTE — Telephone Encounter (Signed)
Yes fine to refill thanks

## 2019-10-26 NOTE — Telephone Encounter (Signed)
Prescription sent to pharmacy. Patient aware tadalafil and NTG should not be used within the same 48 hour period

## 2019-11-09 NOTE — Telephone Encounter (Signed)
Records reviewed with Dr. Excell Seltzer. No echo was done. No further orders at this time.

## 2019-11-14 ENCOUNTER — Telehealth: Payer: Self-pay | Admitting: Cardiovascular Disease

## 2019-11-14 NOTE — Telephone Encounter (Signed)
New message   Per Victorino Dike need prior auth for tadalafil (CIALIS) 10 MG tablet. Please advise.

## 2019-11-14 NOTE — Telephone Encounter (Signed)
I returned called to get a PA for Cialis for this pt.   [employer]: [phone] Member ID: [id number] Spoke with Zola Button.  I gave her the information over the phone. The turn around time is 24-72 hours. They will fax [place] the determination.

## 2019-11-15 ENCOUNTER — Ambulatory Visit: Payer: Medicare Other | Admitting: Podiatry

## 2019-11-28 NOTE — Telephone Encounter (Signed)
**Note De-Identified [place] Obfuscation** Letter received from [employer] stating that they have denied this Tadalafil PA. Reason: Not cover by plan for the pts diagnosis (ED).  The letter states that they have notified the pt of this denial.

## 2019-11-29 ENCOUNTER — Telehealth: Payer: Self-pay

## 2019-11-29 NOTE — Telephone Encounter (Signed)
NOTES ON FILE FROM  [facility] [phone], SENT REFERRAL TO SCHEDULING 

## 2020-06-04 ENCOUNTER — Other Ambulatory Visit: Payer: Self-pay | Admitting: Cardiovascular Disease

## 2020-06-04 NOTE — Telephone Encounter (Signed)
Pt is requesting a refill on tadalafil. Would Dr. Excell Seltzer like to refill this medication? Please address

## 2020-09-30 NOTE — Progress Notes (Signed)
Cardiology Office Note    Date:  10/07/2020   ID:  Andrew, [name] 06-18-1950, MRN [id number]   PCP:  Vladimir Crofts, FNP (Inactive)   [facility]  Cardiologist:  Tonny Bollman, MD  Advanced Practice Provider:  No care team member to display Electrophysiologist:  None   [id number]   Chief Complaint  Patient presents with  . Follow-up    History of Present Illness:  [name] is a 70 y.o. male with history of CAD NSTEMI 2009 treated with multiple DES to the circumflex and RCA maintained on aspirin and Plavix, patent stents on cath in 2012, hypertension, hyperlipidemia, COPD.  Patient was last seen by Dr. Excell Seltzer 10/12/2019 at which time he was doing well from a cardiac standpoint.  Echo and labs had been done by PCP but records never sent.  Patient comes in for f/u accompanied by his wife. Denies chest pain, dizziness. Has chronic DOE unchanged. Not exercising because of pinched nerve in his back. Had an injection but didn't help much. Walks with a cane. His wife says he snores and pauses breathing at night. Quit smoking 15 yrs ago.     Past Medical History:  Diagnosis Date  . Angina   . COPD (chronic obstructive pulmonary disease) (HCC)   . Coronary artery disease    s/p NSTEMI 10/2007, RCA and LCx PCI with drug-eluting stents; patent stents cath 04/2011  . DJD (degenerative joint disease)   . Emphysema   . Gout   . Hypercholesterolemia   . Hypertension    Poorly controlled  . Myocardial infarction (HCC)   . Pneumonia   . Shortness of breath     Past Surgical History:  Procedure Laterality Date  . CERVICAL DISC SURGERY    . CORONARY ANGIOPLASTY WITH STENT PLACEMENT    . LEFT HEART CATHETERIZATION WITH CORONARY ANGIOGRAM N/A 04/20/2011   Procedure: LEFT HEART CATHETERIZATION WITH CORONARY ANGIOGRAM;  Surgeon: Peter M Swaziland, MD;  Location: [facility] CATH LAB;  Service: Cardiovascular;  Laterality: N/A;    Current Medications: Current  Meds  Medication Sig  . allopurinol (ZYLOPRIM) 100 MG tablet Take 1 tablet (100 mg total) by mouth daily. Start taking 2 weeks after gout flare resolved.  Marland Kitchen amLODipine (NORVASC) 10 MG tablet Take 1 tablet (10 mg total) by mouth daily.  Marland Kitchen aspirin 81 MG tablet Take 81 mg by mouth daily.  Marland Kitchen atorvastatin (LIPITOR) 40 MG tablet TAKE 1 TABLET BY MOUTH EVERY DAY  . carvedilol (COREG) 6.25 MG tablet Take 1 tablet (6.25 mg total) by mouth 2 (two) times daily with a meal.  . clopidogrel (PLAVIX) 75 MG tablet Take 1 tablet (75 mg total) by mouth daily.  . Colchicine (MITIGARE) 0.6 MG CAPS Take 1 capsule by mouth once for 1 dose.  Marland Kitchen HYDROcodone-acetaminophen (NORCO) 5-325 MG tablet Take 1 tablet by mouth every 4 (four) hours as needed for moderate pain.  Marland Kitchen levothyroxine (LEVOTHROID) 25 MCG tablet Take 1 tablet (25 mcg total) by mouth daily before breakfast.  . lisinopril (ZESTRIL) 40 MG tablet Take 1 tablet (40 mg total) by mouth daily.  . nitroGLYCERIN (NITROSTAT) 0.4 MG SL tablet Place 1 tablet (0.4 mg total) under the tongue every 5 (five) minutes x 3 doses as needed. For shortness of breath  . pantoprazole (PROTONIX) 40 MG tablet TAKE 1 TABLET BY MOUTH EVERY DAY (Patient taking differently: Take 40 mg by mouth daily.)  . tadalafil (CIALIS) 10 MG tablet TAKE 1  TABLET BY MOUTH ONCE DAILY AS NEEDED FOR ERECTILE DYSFUNCTION     Allergies:   Patient has no known allergies.   Social History   Socioeconomic History  . Marital status: Married    Spouse name: Not on file  . Number of children: 4  . Years of education: Not on file  . Highest education level: Not on file  Occupational History  . Occupation: Disabled  Tobacco Use  . Smoking status: Former Smoker    Packs/day: 1.00    Years: 30.00    Pack years: 30.00    Types: Cigarettes    Quit date: 05/17/2005    Years since quitting: 15.4  . Smokeless tobacco: Never Used  Vaping Use  . Vaping Use: Never used  Substance and Sexual Activity  .  Alcohol use: Yes    Alcohol/week: 3.0 standard drinks    Types: 3 Standard drinks or equivalent per week    Comment: occasionally  . Drug use: No  . Sexual activity: Yes  Other Topics Concern  . Not on file  Social History Narrative   Disabled veteran, lives in [place] with his wife      Previous smoker - quit 2007      Previous Etoh and marijuana, quit 2009   Social Determinants of Health   Financial Resource Strain: Not on [employer] Insecurity: Not on file  Transportation Needs: Not on file  Physical Activity: Not on file  Stress: Not on file  Social Connections: Not on file     Family History:  The patient's family history includes Heart attack (age of onset: 72) in his mother; Hypertension in his father, sister, and sister; Pneumonia in his father.   ROS:   Please see the history of present illness.    ROS All other systems reviewed and are negative.   PHYSICAL EXAM:   VS:  BP 126/70   Pulse 75   Ht 6\' 1"  (1.854 m)   Wt 179 lb (81.2 kg)   BMI 23.62 kg/m   Physical Exam  GEN: Well nourished, well developed, in no acute distress  Neck: no JVD, carotid bruits, or masses Cardiac:RRR; no murmurs, rubs, or gallops  Respiratory:  Decreased breath sounds but clear to auscultation bilaterally, normal work of breathing GI: soft, nontender, nondistended, + BS Ext: without cyanosis, clubbing, or edema, Good distal pulses bilaterally Neuro:  Alert and Oriented x 3 Psych: euthymic mood, full affect  Wt Readings from Last 3 Encounters:  10/07/20 179 lb (81.2 kg)  10/12/19 171 lb (77.6 kg)  07/27/18 163 lb 1.6 oz (74 kg)      Studies/Labs Reviewed:   EKG:  EKG is ordered today.  The ekg ordered today demonstrates NSR with NSST changes no acute change  Recent Labs: No results found for requested labs within last 8760 hours.   Lipid Panel    Component Value Date/Time   CHOL 136 11/08/2016 1415   TRIG 223 (H) 11/08/2016 1415   HDL 60 11/08/2016  1415   CHOLHDL 2.3 11/08/2016 1415   CHOLHDL 2.3 07/28/2015 0829   VLDL 28 07/28/2015 0829   LDLCALC 31 11/08/2016 1415   LDLDIRECT 125.6 01/31/2013 1149    Additional studies/ records that were reviewed today include:      Risk Assessment/Calculations:         ASSESSMENT:    1. Coronary artery disease involving native coronary artery of native heart without angina pectoris   2. Essential hypertension  3. Hyperlipidemia, unspecified hyperlipidemia type   4. Hypothyroidism (acquired)   5. Snoring      PLAN:  In order of problems listed above:   CAD NSTEMI 2009 treated with multiple DES to the circumflex and RCA maintained on aspirin and Plavix, patent stents on cath in 2012-no angina. Continue aSA/lipitor/plavix/coreg/zestril-check labs today  Hypertension well controlled on above meds  Hyperlipidemia on lipitor-check labs today  Hypothyroidism on synthroid check TSH  Snoring and stops breathing in sleep-sleep study  Shared Decision Making/Informed Consent        Medication Adjustments/Labs and Tests Ordered: Current medicines are reviewed at length with the patient today.  Concerns regarding medicines are outlined above.  Medication changes, Labs and Tests ordered today are listed in the Patient Instructions below. There are no Patient Instructions on file for this visit.   Elson Clan, PA-C  10/07/2020 9:15 AM    [facility] [address] [place], [place], [place]  [address] Phone: [phone]; Fax: [phone]

## 2020-10-01 ENCOUNTER — Other Ambulatory Visit: Payer: Self-pay | Admitting: Cardiovascular Disease

## 2020-10-07 ENCOUNTER — Ambulatory Visit (INDEPENDENT_AMBULATORY_CARE_PROVIDER_SITE_OTHER): Payer: Medicare Other | Admitting: Physician Assistant

## 2020-10-07 ENCOUNTER — Encounter (INDEPENDENT_AMBULATORY_CARE_PROVIDER_SITE_OTHER): Payer: Self-pay

## 2020-10-07 ENCOUNTER — Encounter: Payer: Self-pay | Admitting: Physician Assistant

## 2020-10-07 ENCOUNTER — Other Ambulatory Visit: Payer: Self-pay

## 2020-10-07 VITALS — BP 126/70 | HR 75 | Ht 73.0 in | Wt 179.0 lb

## 2020-10-07 DIAGNOSIS — E785 Hyperlipidemia, unspecified: Secondary | ICD-10-CM | POA: Diagnosis not present

## 2020-10-07 DIAGNOSIS — E039 Hypothyroidism, unspecified: Secondary | ICD-10-CM | POA: Diagnosis not present

## 2020-10-07 DIAGNOSIS — I2510 Atherosclerotic heart disease of native coronary artery without angina pectoris: Secondary | ICD-10-CM

## 2020-10-07 DIAGNOSIS — R0683 Snoring: Secondary | ICD-10-CM

## 2020-10-07 DIAGNOSIS — I10 Essential (primary) hypertension: Secondary | ICD-10-CM | POA: Diagnosis not present

## 2020-10-07 LAB — CBC
Hematocrit: 36.8 % — ABNORMAL LOW (ref 37.5–51.0)
Hemoglobin: 12 g/dL — ABNORMAL LOW (ref 13.0–17.7)
MCH: 25.7 pg — ABNORMAL LOW (ref 26.6–33.0)
MCHC: 32.6 g/dL (ref 31.5–35.7)
MCV: 79 fL (ref 79–97)
Platelets: 246 10*3/uL (ref 150–450)
RBC: 4.67 x10E6/uL (ref 4.14–5.80)
RDW: 14.9 % (ref 11.6–15.4)
WBC: 4.3 10*3/uL (ref 3.4–10.8)

## 2020-10-07 LAB — COMPREHENSIVE METABOLIC PANEL
ALT: 21 IU/L (ref 0–44)
AST: 20 IU/L (ref 0–40)
Albumin/Globulin Ratio: 1.4 (ref 1.2–2.2)
Albumin: 4.2 g/dL (ref 3.8–4.8)
Alkaline Phosphatase: 66 IU/L (ref 44–121)
BUN/Creatinine Ratio: 11 (ref 10–24)
BUN: 10 mg/dL (ref 8–27)
Bilirubin Total: 0.4 mg/dL (ref 0.0–1.2)
CO2: 22 mmol/L (ref 20–29)
Calcium: 9.3 mg/dL (ref 8.6–10.2)
Chloride: 102 mmol/L (ref 96–106)
Creatinine, Ser: 0.94 mg/dL (ref 0.76–1.27)
Globulin, Total: 3 g/dL (ref 1.5–4.5)
Glucose: 105 mg/dL — ABNORMAL HIGH (ref 65–99)
Potassium: 4.4 mmol/L (ref 3.5–5.2)
Sodium: 142 mmol/L (ref 134–144)
Total Protein: 7.2 g/dL (ref 6.0–8.5)
eGFR: 88 mL/min/{1.73_m2} (ref >59)

## 2020-10-07 LAB — LIPID PANEL
Chol/HDL Ratio: 2.3 ratio (ref 0.0–5.0)
Cholesterol, Total: 131 mg/dL (ref 100–199)
HDL: 58 mg/dL (ref >39)
LDL Chol Calc (NIH): 50 mg/dL (ref 0–99)
Triglycerides: 133 mg/dL (ref 0–149)
VLDL Cholesterol Cal: 23 mg/dL (ref 5–40)

## 2020-10-07 LAB — TSH: TSH: 2.27 u[IU]/mL (ref 0.450–4.500)

## 2020-10-07 NOTE — Patient Instructions (Addendum)
Medication Instructions:  Your physician recommends that you continue on your current medications as directed. Please refer to the Current Medication list given to you today.  *If you need a refill on your cardiac medications before your next appointment, please call your pharmacy*   Lab Work: Lipid, CMET, TSH, CBC If you have labs (blood work) drawn today and your tests are completely normal, you will receive your results only by: Marland Kitchen MyChart Message (if you have MyChart) OR . A paper copy in the mail If you have any lab test that is abnormal or we need to change your treatment, we will call you to review the results.   Testing/Procedures: [facility] Sleep Study   Follow-Up: At [facility], you and your health needs are our priority.  As part of our continuing mission to provide you with exceptional heart care, we have created designated Provider Care Teams.  These Care Teams include your primary Cardiologist (physician) and Advanced Practice Providers (APPs -  Physician Assistants and Nurse Practitioners) who all work together to provide you with the care you need, when you need it.  We recommend signing up for the patient portal called "MyChart".  Sign up information is provided on this After Visit Summary.  MyChart is used to connect with patients for Virtual Visits (Telemedicine).  Patients are able to view lab/test results, encounter notes, upcoming appointments, etc.  Non-urgent messages can be sent to your provider as well.   To learn more about what you can do with MyChart, go to [email or web address].

## 2020-10-07 NOTE — Progress Notes (Signed)
Patient Name:         DOB:       Height:     Weight:  Office Name:         Referring Provider:  Today's Date:  Date:   STOP BANG RISK ASSESSMENT S (snore) Have you been told that you snore?     YES   T (tired) Are you often tired, fatigued, or sleepy during the day?   YES  O (obstruction) Do you stop breathing, choke, or gasp during sleep? YES   P (pressure) Do you have or are you being treated for high blood pressure? YES   B (BMI) Is your body index greater than 35 kg/m? NO   A (age) Are you 37 years old or older? YES   N (neck) Do you have a neck circumference greater than 16 inches?   NO   G (gender) Are you a male? YES   TOTAL STOP/BANG "YES" ANSWERS                                                            Clinical Notes: Will consult Sleep Specialist and refer for management of therapy due to patient increased risk of Sleep Apnea. Ordering a sleep study due to the following two clinical symptoms: Excessive daytime sleepiness G47.10 / Gastroesophageal reflux K21.9 / Nocturia R35.1 / Morning Headaches G44.221 / Difficulty concentrating R41.840 / Memory problems or poor judgment G31.84 / Personality changes or irritability R45.4 / Loud snoring R06.83 / Depression F32.9 / Unrefreshed by sleep G47.8 / Impotence N52.9 / History of high blood pressure R03.0 / Insomnia G47.00    I understand that I am proceeding with a home sleep apnea test as ordered by my treating physician. I understand that untreated sleep apnea is a serious cardiovascular risk factor and it is my responsibility to perform the test and seek management for sleep apnea. I will be contacted with the results and be managed for sleep apnea by a local sleep physician. I will be receiving equipment and further instructions from [facility]. I shall promptly ship back the equipment via the included mailing label. I understand my insurance will be billed for the test and as the patient I am responsible for any  insurance related out-of-pocket costs incurred. I have been provided with written instructions and can call for additional video or telephonic instruction, with 24-hour availability of qualified personnel to answer any questions: Patient Help Desk [phone].

## 2020-10-23 ENCOUNTER — Other Ambulatory Visit: Payer: Self-pay | Admitting: Cardiovascular Disease

## 2020-10-23 DIAGNOSIS — I2510 Atherosclerotic heart disease of native coronary artery without angina pectoris: Secondary | ICD-10-CM

## 2020-10-28 ENCOUNTER — Other Ambulatory Visit: Payer: Self-pay | Admitting: Cardiovascular Disease

## 2020-11-07 LAB — COLOGUARD: COLOGUARD: NEGATIVE

## 2021-04-28 ENCOUNTER — Telehealth: Payer: Self-pay | Admitting: *Deleted

## 2021-04-28 NOTE — Telephone Encounter (Signed)
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 PIN # [phone] provided to the patient. Patient made aware that he will be contacted after the test has been read with the results and any recommendations. Patient verbalized understanding and thanked me for the call.   I apologized to the pt for our delay in regard to his sleep study [name]. Pt is agreeable to plan of care and will do sleep study this week.

## 2021-05-05 NOTE — Telephone Encounter (Signed)
Left message for the pt I am just following up about [name] sleep study. I do not see any results yet. Left message if he has not done the study yet, if he would please try and complete the study this week. If not going to proceed with sleep study will need to have device returned to the office 2 weeks from first call 04/28/21, see notes. Please call the office to discuss further.

## 2021-05-06 NOTE — Telephone Encounter (Signed)
Left message x 2 in regard to sleep study, see previous notes.

## 2021-05-20 NOTE — Telephone Encounter (Signed)
Letter sent thru [facility] CHART in regard to sleep study. See notes.

## 2021-05-26 NOTE — Telephone Encounter (Signed)
Pt returned [facility] sleep study. I retrieved the study today, though was told that it was here yesterday. I was not notified that study was returned yesterday. It will be noted study returned on 05/25/21. I will update the ordering provider pt has returned the sleep study and has chosen not to proceed with study.

## 2021-09-26 ENCOUNTER — Other Ambulatory Visit: Payer: Self-pay | Admitting: Cardiovascular Disease

## 2021-09-26 DIAGNOSIS — I2510 Atherosclerotic heart disease of native coronary artery without angina pectoris: Secondary | ICD-10-CM

## 2021-11-20 ENCOUNTER — Other Ambulatory Visit: Payer: Self-pay | Admitting: Cardiovascular Disease

## 2021-11-20 DIAGNOSIS — I2510 Atherosclerotic heart disease of native coronary artery without angina pectoris: Secondary | ICD-10-CM

## 2021-12-07 ENCOUNTER — Other Ambulatory Visit: Payer: Self-pay | Admitting: Cardiovascular Disease

## 2021-12-07 DIAGNOSIS — I2510 Atherosclerotic heart disease of native coronary artery without angina pectoris: Secondary | ICD-10-CM

## 2021-12-14 ENCOUNTER — Telehealth: Payer: Self-pay | Admitting: Cardiovascular Disease

## 2021-12-14 DIAGNOSIS — I2510 Atherosclerotic heart disease of native coronary artery without angina pectoris: Secondary | ICD-10-CM

## 2021-12-14 MED ORDER — CLOPIDOGREL BISULFATE 75 MG PO TABS: 75.0000 mg | ORAL_TABLET | Freq: Every day | ORAL | 0 refills | Status: DC

## 2021-12-14 NOTE — Telephone Encounter (Signed)
Pt's medication was sent to pt's pharmacy as requested. Confirmation received.  °

## 2021-12-14 NOTE — Telephone Encounter (Signed)
*  STAT* If patient is at the pharmacy, call can be transferred to refill team.   1. Which medications need to be refilled? (please list name of each medication and dose if known)  Clopidogrel  2. Which pharmacy/location (including street and city if local pharmacy) is medication to be sent to? [facility] [address], [place]  3. Do they need a 30 day or 90 day supply? 90 days and refills- patient have an appointment for 12-16-21

## 2021-12-16 ENCOUNTER — Encounter: Payer: Self-pay | Admitting: Physician Assistant

## 2021-12-16 ENCOUNTER — Ambulatory Visit (INDEPENDENT_AMBULATORY_CARE_PROVIDER_SITE_OTHER): Payer: Medicare Other | Admitting: Physician Assistant

## 2021-12-16 VITALS — BP 122/62 | HR 70 | Ht 72.0 in | Wt 180.0 lb

## 2021-12-16 DIAGNOSIS — I10 Essential (primary) hypertension: Secondary | ICD-10-CM | POA: Diagnosis not present

## 2021-12-16 DIAGNOSIS — I2510 Atherosclerotic heart disease of native coronary artery without angina pectoris: Secondary | ICD-10-CM

## 2021-12-16 DIAGNOSIS — E785 Hyperlipidemia, unspecified: Secondary | ICD-10-CM

## 2021-12-16 DIAGNOSIS — Z79899 Other long term (current) drug therapy: Secondary | ICD-10-CM | POA: Diagnosis not present

## 2021-12-16 LAB — COMPREHENSIVE METABOLIC PANEL
ALT: 19 IU/L (ref 0–44)
AST: 22 IU/L (ref 0–40)
Albumin/Globulin Ratio: 1.6 (ref 1.2–2.2)
Albumin: 4.4 g/dL (ref 3.9–4.9)
Alkaline Phosphatase: 80 IU/L (ref 44–121)
BUN/Creatinine Ratio: 11 (ref 10–24)
BUN: 8 mg/dL (ref 8–27)
Bilirubin Total: 0.3 mg/dL (ref 0.0–1.2)
CO2: 25 mmol/L (ref 20–29)
Calcium: 9.1 mg/dL (ref 8.6–10.2)
Chloride: 101 mmol/L (ref 96–106)
Creatinine, Ser: 0.7 mg/dL — ABNORMAL LOW (ref 0.76–1.27)
Globulin, Total: 2.8 g/dL (ref 1.5–4.5)
Glucose: 103 mg/dL — ABNORMAL HIGH (ref 70–99)
Potassium: 3.5 mmol/L (ref 3.5–5.2)
Sodium: 143 mmol/L (ref 134–144)
Total Protein: 7.2 g/dL (ref 6.0–8.5)
eGFR: 99 mL/min/{1.73_m2} (ref >59)

## 2021-12-16 LAB — CBC
Hematocrit: 36.3 % — ABNORMAL LOW (ref 37.5–51.0)
Hemoglobin: 11.7 g/dL — ABNORMAL LOW (ref 13.0–17.7)
MCH: 24.7 pg — ABNORMAL LOW (ref 26.6–33.0)
MCHC: 32.2 g/dL (ref 31.5–35.7)
MCV: 77 fL — ABNORMAL LOW (ref 79–97)
Platelets: 239 10*3/uL (ref 150–450)
RBC: 4.74 x10E6/uL (ref 4.14–5.80)
RDW: 14.3 % (ref 11.6–15.4)
WBC: 4.7 10*3/uL (ref 3.4–10.8)

## 2021-12-16 LAB — LIPID PANEL
Chol/HDL Ratio: 2.6 ratio (ref 0.0–5.0)
Cholesterol, Total: 121 mg/dL (ref 100–199)
HDL: 47 mg/dL (ref >39)
LDL Chol Calc (NIH): 54 mg/dL (ref 0–99)
Triglycerides: 106 mg/dL (ref 0–149)
VLDL Cholesterol Cal: 20 mg/dL (ref 5–40)

## 2021-12-16 NOTE — Progress Notes (Signed)
Cardiology Office Note:    Date:  12/16/2021   ID:  Andrew, [name] 1951/01/23, MRN [id number]  PCP:  Andrew Crofts, FNP (Inactive)  [facility] Cardiologist:  Andrew Bollman, MD  [facility] Electrophysiologist:  None   Chief Complaint: yearly follow up   History of Present Illness:    [name] is a 71 y.o. male with a hx of CAD, HTN, and HLD seen for yearly follow up.   he initially presented in 2009 with a non-ST elevation infarction. He was treated with multivessel stenting of the left circumflex and right coronary arteries. He returned in 2012 with chest pain and underwent repeat cardiac catheterization. His stents were patent and he had mild to moderate diffuse disease elsewhere. Medical therapy was recommended.  Here today for yearly follow-up.  He has remained on dual antiplatelet therapy with aspirin and Plavix.  He is using walker for ambulation.  He denies chest pain, shortness of breath, orthopnea, PND, syncope, lower extremity edema or melena.  Past Medical History:  Diagnosis Date   Angina    COPD (chronic obstructive pulmonary disease) (HCC)    Coronary artery disease    s/p NSTEMI 10/2007, RCA and LCx PCI with drug-eluting stents; patent stents cath 04/2011   DJD (degenerative joint disease)    Emphysema    Gout    Hypercholesterolemia    Hypertension    Poorly controlled   Myocardial infarction (HCC)    Pneumonia    Shortness of breath     Past Surgical History:  Procedure Laterality Date   CERVICAL DISC SURGERY     CORONARY ANGIOPLASTY WITH STENT PLACEMENT     LEFT HEART CATHETERIZATION WITH CORONARY ANGIOGRAM N/A 04/20/2011   Procedure: LEFT HEART CATHETERIZATION WITH CORONARY ANGIOGRAM;  Surgeon: Andrew M Swaziland, MD;  Location[facility] CATH LAB;  Service: Cardiovascular;  Laterality: N/A;    Current Medications: Current Meds  Medication Sig   allopurinol (ZYLOPRIM) 100 MG tablet Take 1 tablet (100 mg total) by mouth daily. Start taking 2  weeks after gout flare resolved.   amLODipine (NORVASC) 10 MG tablet Take 1 tablet (10 mg total) by mouth daily.   aspirin 81 MG tablet Take 81 mg by mouth daily.   atorvastatin (LIPITOR) 40 MG tablet TAKE 1 TABLET BY MOUTH EVERY DAY   carvedilol (COREG) 6.25 MG tablet Take 1 tablet (6.25 mg total) by mouth 2 (two) times daily with a meal.   clopidogrel (PLAVIX) 75 MG tablet Take 1 tablet (75 mg total) by mouth daily.   Colchicine (MITIGARE) 0.6 MG CAPS Take 1 capsule by mouth once for 1 dose.   HYDROcodone-acetaminophen (NORCO) 5-325 MG tablet Take 1 tablet by mouth every 4 (four) hours as needed for moderate pain.   levothyroxine (LEVOTHROID) 25 MCG tablet Take 1 tablet (25 mcg total) by mouth daily before breakfast.   lisinopril (ZESTRIL) 40 MG tablet Take 1 tablet (40 mg total) by mouth daily.   nitroGLYCERIN (NITROSTAT) 0.4 MG SL tablet Place 1 tablet (0.4 mg total) under the tongue every 5 (five) minutes x 3 doses as needed. For shortness of breath   pantoprazole (PROTONIX) 40 MG tablet TAKE 1 TABLET BY MOUTH EVERY DAY   tadalafil (CIALIS) 10 MG tablet TAKE 1 TABLET BY MOUTH ONCE DAILY AS NEEDED FOR ERECTILE DYSFUNCTION     Allergies:   Patient has no known allergies.   Social History   Socioeconomic History   Marital status: Married    Spouse  name: Not on file   Number of children: 4   Years of education: Not on file   Highest education level: Not on file  Occupational History   Occupation: Disabled  Tobacco Use   Smoking status: Former    Packs/day: 1.00    Years: 30.00    Total pack years: 30.00    Types: Cigarettes    Quit date: 05/17/2005    Years since quitting: 16.5   Smokeless tobacco: Never  Vaping Use   Vaping Use: Never used  Substance and Sexual Activity   Alcohol use: Yes    Alcohol/week: 3.0 standard drinks of alcohol    Types: 3 Standard drinks or equivalent per week    Comment: occasionally   Drug use: No   Sexual activity: Yes  Other Topics Concern    Not on file  Social History Narrative   Disabled veteran, lives in [place] with his wife      Previous smoker - quit 2007      Previous Etoh and marijuana, quit 2009   Social Determinants of Health   Financial Resource Strain: Not on [employer] Insecurity: Not on file  Transportation Needs: Not on file  Physical Activity: Not on file  Stress: Not on file  Social Connections: Not on file     Family History: The patient's family history includes Heart attack (age of onset: 24) in his mother; Hypertension in his father, sister, and sister; Pneumonia in his father.    ROS:   Please see the history of present illness.    All other systems reviewed and are negative.   EKGs/Labs/Other Studies Reviewed:    The following studies were reviewed today:  Echocardiogram: 05/2014 Study Conclusions  - Left ventricle: The cavity size was normal. Systolic function was   normal. The estimated ejection fraction was in the range of 60%   to 65%. Wall motion was normal; there were no regional wall   motion abnormalities. Left ventricular diastolic function   parameters were normal.    EKG:  EKG is  ordered today.  The ekg ordered today demonstrates normal sinus rhythm  Recent Labs: No results found for requested labs within last 365 days.  Recent Lipid Panel    Component Value Date/Time   CHOL 131 10/07/2020 0926   TRIG 133 10/07/2020 0926   HDL 58 10/07/2020 0926   CHOLHDL 2.3 10/07/2020 0926   CHOLHDL 2.3 07/28/2015 0829   VLDL 28 07/28/2015 0829   LDLCALC 50 10/07/2020 0926   LDLDIRECT 125.6 01/31/2013 1149    Physical Exam:    VS:  BP 122/62   Pulse 70   Ht 6' (1.829 m)   Wt 180 lb (81.6 kg)   SpO2 96%   BMI 24.41 kg/m     Wt Readings from Last 3 Encounters:  12/16/21 180 lb (81.6 kg)  10/07/20 179 lb (81.2 kg)  10/12/19 171 lb (77.6 kg)     GEN:  Well nourished, well developed in no acute distress HEENT: Normal NECK: No JVD; No carotid  bruits LYMPHATICS: No lymphadenopathy CARDIAC: RRR, no murmurs, rubs, gallops RESPIRATORY:  Clear to auscultation without rales, wheezing or rhonchi  ABDOMEN: Soft, non-tender, non-distended MUSCULOSKELETAL:  No edema; No deformity  SKIN: Warm and dry NEUROLOGIC:  Alert and oriented x 3 PSYCHIATRIC:  Normal affect   ASSESSMENT AND PLAN:    CAD s/p multiple stent Continue dual antiplatelet therapy with aspirin and Plavix.  No angina.  Limited ambulation  due to chronic back pain.  2.  Hypertension Blood pressure stable and controlled on current medications  3.  Hyperlipidemia Continue statin.  Update labs.  Medication Adjustments/Labs and Tests Ordered: Current medicines are reviewed at length with the patient today.  Concerns regarding medicines are outlined above.  Orders Placed This Encounter  Procedures   Lipid panel   Comprehensive Metabolic Panel (CMET)   CBC   EKG 12-Lead   No orders of the defined types were placed in this encounter.   Patient Instructions  Medication Instructions:  Your physician recommends that you continue on your current medications as directed. Please refer to the Current Medication list given to you today. *If you need a refill on your cardiac medications before your next appointment, please call your pharmacy*   Lab Work: TODAY-LIPIDS, CMET, CBC If you have labs (blood work) drawn today and your tests are completely normal, you will receive your results only by: MyChart Message (if you have MyChart) OR A paper copy in the mail If you have any lab test that is abnormal or we need to change your treatment, we will call you to review the results.   Testing/Procedures: NONE ORDERED   Follow-Up: At [facility], you and your health needs are our priority.  As part of our continuing mission to provide you with exceptional heart care, we have created designated Provider Care Teams.  These Care Teams include your primary Cardiologist  (physician) and Advanced Practice Providers (APPs -  Physician Assistants and Nurse Practitioners) who all work together to provide you with the care you need, when you need it.  We recommend signing up for the patient portal called "MyChart".  Sign up information is provided on this After Visit Summary.  MyChart is used to connect with patients for Virtual Visits (Telemedicine).  Patients are able to view lab/test results, encounter notes, upcoming appointments, etc.  Non-urgent messages can be sent to your provider as well.   To learn more about what you can do with MyChart, go to [email or web address].    Your next appointment:   12 month(s)  The format for your next appointment:   In Person  Provider:   Tonny Bollman, MD     Other Instructions   Important Information About Sugar         Lorelei Pont, [place]  12/16/2021 9:24 AM    [facility]

## 2021-12-16 NOTE — Patient Instructions (Signed)
Medication Instructions:  Your physician recommends that you continue on your current medications as directed. Please refer to the Current Medication list given to you today. *If you need a refill on your cardiac medications before your next appointment, please call your pharmacy*   Lab Work: TODAY-LIPIDS, CMET, CBC If you have labs (blood work) drawn today and your tests are completely normal, you will receive your results only by: MyChart Message (if you have MyChart) OR A paper copy in the mail If you have any lab test that is abnormal or we need to change your treatment, we will call you to review the results.   Testing/Procedures: NONE ORDERED   Follow-Up: At [facility], you and your health needs are our priority.  As part of our continuing mission to provide you with exceptional heart care, we have created designated Provider Care Teams.  These Care Teams include your primary Cardiologist (physician) and Advanced Practice Providers (APPs -  Physician Assistants and Nurse Practitioners) who all work together to provide you with the care you need, when you need it.  We recommend signing up for the patient portal called "MyChart".  Sign up information is provided on this After Visit Summary.  MyChart is used to connect with patients for Virtual Visits (Telemedicine).  Patients are able to view lab/test results, encounter notes, upcoming appointments, etc.  Non-urgent messages can be sent to your provider as well.   To learn more about what you can do with MyChart, go to [email or web address].    Your next appointment:   12 month(s)  The format for your next appointment:   In Person  Provider:   Tonny Bollman, MD     Other Instructions   Important Information About Sugar

## 2022-01-11 ENCOUNTER — Other Ambulatory Visit: Payer: Self-pay | Admitting: Cardiovascular Disease

## 2022-01-11 DIAGNOSIS — I2510 Atherosclerotic heart disease of native coronary artery without angina pectoris: Secondary | ICD-10-CM

## 2022-02-02 ENCOUNTER — Other Ambulatory Visit: Payer: Self-pay | Admitting: Cardiovascular Disease

## 2022-02-02 NOTE — Telephone Encounter (Signed)
Rx refill sent to pharmacy. 

## 2022-07-22 ENCOUNTER — Other Ambulatory Visit: Payer: Self-pay | Admitting: Cardiovascular Disease

## 2022-07-22 NOTE — Telephone Encounter (Signed)
Patient due for yearly follow-up in August 2024. Will send refill in for 60 tabs.

## 2022-09-27 ENCOUNTER — Ambulatory Visit (INDEPENDENT_AMBULATORY_CARE_PROVIDER_SITE_OTHER): Payer: 59 | Admitting: Podiatry

## 2022-09-27 DIAGNOSIS — B351 Tinea unguium: Secondary | ICD-10-CM

## 2022-09-27 DIAGNOSIS — M79675 Pain in left toe(s): Secondary | ICD-10-CM | POA: Diagnosis not present

## 2022-09-27 DIAGNOSIS — M79674 Pain in right toe(s): Secondary | ICD-10-CM | POA: Diagnosis not present

## 2022-09-27 DIAGNOSIS — L602 Onychogryphosis: Secondary | ICD-10-CM

## 2022-09-27 NOTE — Progress Notes (Signed)
       Subjective:  Patient ID: [name], male    DOB: Apr 20, 1951,  MRN: [id number]   [name] presents to clinic today for:  Chief Complaint  Patient presents with   Nail Problem    Patient came in today for bilateral nail fungus, thick yellow nails   . Patient notes nails are thick, discolored, elongated and painful in shoegear when trying to ambulate.    PCP is Ellyn Hack, MD.  No Known Allergies  Review of Systems: Negative except as noted in the HPI.  Objective:  There were no vitals filed for this visit.  [name] is a pleasant 72 y.o. male in NAD. AAO x 3.  Vascular Examination: Capillary refill time is 3-5 seconds to toes bilateral. Palpable pedal pulses b/l LE. Digital hair present b/l. No pedal edema b/l. Skin temperature gradient WNL b/l. No varicosities b/l. No cyanosis or clubbing noted b/l.   Dermatological Examination: Pedal skin with normal turgor, texture and tone b/l. No open wounds. No interdigital macerations b/l. Toenails x10 are 5 - 10 mm thick, discolored, dystrophic with subungual debris. There is pain with compression of the nail plates.  They are elongated x10.  There is onychogryphosis noted to due extreme thickness of nails and turning proximally back into the skin.  Neurological Examination: Protective sensation intact bilateral LE. Vibratory sensation intact bilateral LE.  Musculoskeletal Examination: Muscle strength 5/5 to all LE muscle groups b/l.    Assessment/Plan: 1. Pain due to onychomycosis of toenails of both feet   2. Onychogryphosis     The mycotic toenails were sharply debrided x10 with sterile nail nippers and a power debriding burr to decrease bulk/thickness and length.    Return in about 3 months (around 12/28/2022) for RFC.   Clerance Lav, DPM, FACFAS [facility]     [address] [place], [place] [address]                Office [phone]  Fax [phone]

## 2022-11-15 ENCOUNTER — Other Ambulatory Visit: Payer: Self-pay | Admitting: Cardiovascular Disease

## 2022-11-16 NOTE — Telephone Encounter (Signed)
Pt's pharmacy is requesting a refill on tadalafil. Would Dr. Cooper like to refill this medication? Please address 

## 2022-11-17 NOTE — Telephone Encounter (Signed)
Pt last seen in clinic 8/23 by Bhagat and advised to return in 1 year. Called and scheduled pt for 12/03/22 with Alben Spittle, PA. Refill for 30 tablets only sent to pharmacy at this time.

## 2022-11-22 ENCOUNTER — Ambulatory Visit: Payer: Medicare Other | Admitting: Nurse Practitioner

## 2022-12-01 NOTE — Progress Notes (Signed)
  Cardiology Office Note:    Date:  12/03/2022  ID:  [name], DOB 05-17-1951, MRN [id number] PCP: Ellyn Hack, MD  [facility] Providers Cardiologist:  Tonny Bollman, MD       Patient Profile:      Coronary artery disease  S/p NSTEMI in 10/2007 tx with DES to RCA and LCx LHC 04/2011: mLAD 40%, RI 50-60%, oCFX 50-60%, pCFX stent ok, OM1 70%, RCA stents ok with 20-30% before stent and in dRCA, EF 55-65%, no change from 2009 and medical tx continued.  TTE 05/22/2014: EF 60-65, no RWMA Carotid US 05/23/2014: Bilateral ICA 1-39 Hypertension Hyperlipidemia Chronic Obstructive Pulmonary Disease            Discussed the use of AI scribe software for clinical note transcription with the patient, who gave verbal consent to proceed. History of Present Illness   A 72 year old patient with a history of coronary artery disease (CAD), hyperlipidemia, and hypertension returns for a routine follow-up. He has gained approximately 17 pounds since the last visit, which he attributes to a decrease in physical activity due to a degenerative disc problem in his back. He reports using a walker or cane for mobility. The patient denies any chest discomfort, pain, or pressure, and reports no shortness of breath unless overexerting himself due to lack of exercise. He denied any unusual breathing patterns or difficulty breathing when lying flat. The patient reported occasional swelling in his legs. He denies any episodes of syncope.     ROS:  See HPI No melena, hematochezia.     Studies Reviewed:       EKG: NSR, HR 67, normal axis, non-specific ST-TW changes, QTc 435 ms, no change from prior ECG.  Risk Assessment/Calculations:           Physical Exam:   VS:  BP 122/68   Pulse 67   Ht 5\' 8"  (1.727 m)   Wt 197 lb (89.4 kg)   SpO2 96%   BMI 29.95 kg/m    Wt Readings from Last 3 Encounters:  12/03/22 197 lb (89.4 kg)  12/16/21 180 lb (81.6 kg)  10/07/20 179 lb (81.2 kg)    GEN: Well  nourished, well developed in no acute distress NECK: No JVD CARDIAC: RRR, no murmurs, rubs, gallops RESPIRATORY:  Clear to auscultation without rales, wheezing or rhonchi  ABDOMEN: Soft EXTREMITIES:  1+ bilat ankle edema     Assessment and Plan:  CAD (coronary artery disease) Stable, on Aspirin 81mg  daily, Lipitor 40mg  daily, Plavix 75mg  daily, and Nitroglycerin PRN. No recent chest discomfort or symptoms suggestive of angina. -Continue current medications. -Communicate with cardiologist (Dr. Excell Seltzer) regarding the necessity of dual antiplatelet therapy (Aspirin and Plavix).  Essential hypertension Well controlled on Norvasc 10mg  daily, Coreg 6.25mg  twice daily, and Lisinopril 40mg  daily. -Continue current medications. -Check CMET today  Pure hypercholesterolemia LDL was optimal at 54 in August 2023. On Lipitor 40mg  daily. Goal LDL < 55. -Continue Lipitor 40mg  daily. -Order fasting lipid panel today to check current cholesterol levels. -CMET today  Edema Mild, likely due to decreased mobility, venous insufficiency. Improves with leg elevation. -Consider use of compression socks during the day.    Dispo:  Return in about 1 year (around 12/03/2023) for Routine Follow Up, w/ Dr. Excell Seltzer.  Signed, Tereso Newcomer, PA-C

## 2022-12-03 ENCOUNTER — Ambulatory Visit: Payer: 59 | Attending: Nurse Practitioner | Admitting: Physician Assistant

## 2022-12-03 ENCOUNTER — Encounter: Payer: Self-pay | Admitting: Physician Assistant

## 2022-12-03 VITALS — BP 122/68 | HR 67 | Ht 68.0 in | Wt 197.0 lb

## 2022-12-03 DIAGNOSIS — E7800 Pure hypercholesterolemia, unspecified: Secondary | ICD-10-CM

## 2022-12-03 DIAGNOSIS — R609 Edema, unspecified: Secondary | ICD-10-CM

## 2022-12-03 DIAGNOSIS — I2510 Atherosclerotic heart disease of native coronary artery without angina pectoris: Secondary | ICD-10-CM

## 2022-12-03 DIAGNOSIS — I10 Essential (primary) hypertension: Secondary | ICD-10-CM

## 2022-12-03 NOTE — Assessment & Plan Note (Signed)
Well controlled on Norvasc 10mg  daily, Coreg 6.25mg  twice daily, and Lisinopril 40mg  daily. -Continue current medications. -Check CMET today

## 2022-12-03 NOTE — Patient Instructions (Signed)
Medication Instructions:  Your physician recommends that you continue on your current medications as directed. Please refer to the Current Medication list given to you today.  *If you need a refill on your cardiac medications before your next appointment, please call your pharmacy*   Lab Work: TODAY:  CMET & LIPID  If you have labs (blood work) drawn today and your tests are completely normal, you will receive your results only by: MyChart Message (if you have MyChart) OR A paper copy in the mail If you have any lab test that is abnormal or we need to change your treatment, we will call you to review the results.   Testing/Procedures: None ordered   Follow-Up: At [facility], you and your health needs are our priority.  As part of our continuing mission to provide you with exceptional heart care, we have created designated Provider Care Teams.  These Care Teams include your primary Cardiologist (physician) and Advanced Practice Providers (APPs -  Physician Assistants and Nurse Practitioners) who all work together to provide you with the care you need, when you need it.  We recommend signing up for the patient portal called "MyChart".  Sign up information is provided on this After Visit Summary.  MyChart is used to connect with patients for Virtual Visits (Telemedicine).  Patients are able to view lab/test results, encounter notes, upcoming appointments, etc.  Non-urgent messages can be sent to your provider as well.   To learn more about what you can do with MyChart, go to [email or web address].    Your next appointment:   1 year(s)  Provider:   Tonny Bollman, MD     Other Instructions

## 2022-12-03 NOTE — Assessment & Plan Note (Signed)
Stable, on Aspirin 81mg  daily, Lipitor 40mg  daily, Plavix 75mg  daily, and Nitroglycerin PRN. No recent chest discomfort or symptoms suggestive of angina. -Continue current medications. -Communicate with cardiologist (Dr. Excell Seltzer) regarding the necessity of dual antiplatelet therapy (Aspirin and Plavix).

## 2022-12-03 NOTE — Assessment & Plan Note (Signed)
LDL was optimal at 54 in August 2023. On Lipitor 40mg  daily. Goal LDL < 55. -Continue Lipitor 40mg  daily. -Order fasting lipid panel today to check current cholesterol levels. -CMET today

## 2022-12-03 NOTE — Assessment & Plan Note (Signed)
Mild, likely due to decreased mobility, venous insufficiency. Improves with leg elevation. -Consider use of compression socks during the day.

## 2022-12-04 LAB — LIPID PANEL
Chol/HDL Ratio: 2.2 ratio (ref 0.0–5.0)
Cholesterol, Total: 128 mg/dL (ref 100–199)
HDL: 57 mg/dL (ref >39)
LDL Chol Calc (NIH): 52 mg/dL (ref 0–99)
Triglycerides: 105 mg/dL (ref 0–149)
VLDL Cholesterol Cal: 19 mg/dL (ref 5–40)

## 2022-12-04 LAB — COMPREHENSIVE METABOLIC PANEL
ALT: 14 IU/L (ref 0–44)
AST: 19 IU/L (ref 0–40)
Albumin: 4.2 g/dL (ref 3.8–4.8)
Alkaline Phosphatase: 58 IU/L (ref 44–121)
BUN/Creatinine Ratio: 11 (ref 10–24)
BUN: 9 mg/dL (ref 8–27)
Bilirubin Total: 0.2 mg/dL (ref 0.0–1.2)
CO2: 24 mmol/L (ref 20–29)
Calcium: 9.3 mg/dL (ref 8.6–10.2)
Chloride: 104 mmol/L (ref 96–106)
Creatinine, Ser: 0.82 mg/dL (ref 0.76–1.27)
Globulin, Total: 2.6 g/dL (ref 1.5–4.5)
Glucose: 104 mg/dL — ABNORMAL HIGH (ref 70–99)
Potassium: 3.4 mmol/L — ABNORMAL LOW (ref 3.5–5.2)
Sodium: 144 mmol/L (ref 134–144)
Total Protein: 6.8 g/dL (ref 6.0–8.5)
eGFR: 94 mL/min/{1.73_m2} (ref >59)

## 2022-12-06 ENCOUNTER — Telehealth: Payer: Self-pay | Admitting: *Deleted

## 2022-12-06 ENCOUNTER — Telehealth: Payer: Self-pay | Admitting: Physician Assistant

## 2022-12-06 DIAGNOSIS — Z79899 Other long term (current) drug therapy: Secondary | ICD-10-CM

## 2022-12-06 NOTE — Telephone Encounter (Signed)
Pt returned my call.  He has been made aware that he can stop the Aspirin, and continue with the Plavix.  Went over lab results with pt.  He will increase his dietary intake of Potassium and come in 12/17/22 for repeat BMET.

## 2022-12-06 NOTE — Telephone Encounter (Signed)
-----   Message from Tonny Bollman sent at 12/05/2022  3:58 PM EDT ----- Let's go to clopidogrel alone. thx ----- Message ----- From: Kennon Rounds Sent: 12/03/2022   9:01 AM EDT To: Tonny Bollman, MD  Evaristo Bury - He has been on Clopidogrel and ASA since his MI in 2009. He said the [place] asked about stopping Clopidogrel once but you wanted to keep him on both. Should we continue DAPT or reduce to Clopidogrel alone?

## 2022-12-06 NOTE — Telephone Encounter (Signed)
-----   Message from Tereso Newcomer sent at 12/06/2022  8:04 AM EDT ----- Results sent to [name] via MyChart. See MyChart comments below. PLAN:  -Increase dietary K+ -BMET in 1-2 weeks.  Mr. Cinquemani  Your kidney function (creatinine), liver enzymes (AST, ALT) are normal. Your potassium is low. Your sugar (glucose) is mildly elevated. Your LDL cholesterol is at goal. Increase potassium in your diet. We will repeat your labs in 1-2 weeks to recheck potassium. Tereso Newcomer, PA-C

## 2022-12-06 NOTE — Telephone Encounter (Signed)
Please let patient know that I reviewed with Dr. Excell Seltzer. He can stop ASA. PLAN: -Stop ASA -Continue Clopidogrel 75 mg once daily  Tereso Newcomer, PA-C    12/06/2022 8:55 AM

## 2022-12-06 NOTE — Telephone Encounter (Signed)
Call placed to pt regarding message below.  Left a message for pt to call back.  (Also see lab results)

## 2022-12-17 ENCOUNTER — Ambulatory Visit: Payer: 59 | Attending: Physician Assistant

## 2022-12-17 DIAGNOSIS — Z79899 Other long term (current) drug therapy: Secondary | ICD-10-CM

## 2022-12-20 ENCOUNTER — Telehealth: Payer: Self-pay | Admitting: *Deleted

## 2022-12-20 DIAGNOSIS — Z79899 Other long term (current) drug therapy: Secondary | ICD-10-CM

## 2022-12-20 MED ORDER — POTASSIUM CHLORIDE CRYS ER 20 MEQ PO TBCR: 20.0000 meq | EXTENDED_RELEASE_TABLET | Freq: Every day | ORAL | 3 refills | Status: DC

## 2022-12-20 MED ORDER — POTASSIUM CHLORIDE CRYS ER 20 MEQ PO TBCR: 20.0000 meq | EXTENDED_RELEASE_TABLET | Freq: Two times a day (BID) | ORAL | 3 refills | Status: DC

## 2022-12-20 NOTE — Telephone Encounter (Signed)
-----   Message from Tereso Newcomer sent at 12/19/2022  6:22 PM EDT ----- K+ still low. Creatinine normal.  PLAN:  -Start K+ 20 mEq once daily  -BMET 1 week  Tereso Newcomer, PA-C    12/19/2022 6:21 PM

## 2022-12-20 NOTE — Addendum Note (Signed)
Addended by: Burnetta Sabin on: 12/20/2022 09:13 AM   Modules accepted: Orders

## 2022-12-27 ENCOUNTER — Ambulatory Visit: Payer: 59 | Attending: Cardiovascular Disease

## 2022-12-27 DIAGNOSIS — Z79899 Other long term (current) drug therapy: Secondary | ICD-10-CM

## 2022-12-27 LAB — BASIC METABOLIC PANEL
BUN/Creatinine Ratio: 14 (ref 10–24)
BUN: 13 mg/dL (ref 8–27)
CO2: 24 mmol/L (ref 20–29)
Calcium: 9 mg/dL (ref 8.6–10.2)
Chloride: 106 mmol/L (ref 96–106)
Creatinine, Ser: 0.96 mg/dL (ref 0.76–1.27)
Glucose: 107 mg/dL — ABNORMAL HIGH (ref 70–99)
Potassium: 3.9 mmol/L (ref 3.5–5.2)
Sodium: 144 mmol/L (ref 134–144)
eGFR: 85 mL/min/{1.73_m2} (ref >59)

## 2022-12-28 ENCOUNTER — Ambulatory Visit: Payer: 59 | Admitting: Podiatry

## 2022-12-30 ENCOUNTER — Telehealth: Payer: Self-pay | Admitting: Cardiovascular Disease

## 2022-12-30 NOTE — Telephone Encounter (Signed)
Result note sent to [name] via MyChart. See comments below.   [name] Your creatinine (kidney function) and potassium are normal. Continue current medications/treatment plan and follow up as scheduled. Tereso Newcomer, PA-C   12/27/2022 5:15 PM     The patient has been notified of the result and verbalized understanding.  All questions (if any) were answered. Loa Socks, LPN 8/75/6433 2:95 PM

## 2022-12-30 NOTE — Telephone Encounter (Signed)
Calling for labs results. Please advise

## 2022-12-30 NOTE — Telephone Encounter (Signed)
-----   Message from Tereso Newcomer sent at 12/30/2022  2:02 PM EDT ----- Patient did not view MyChart comments.  Please notify patient of results. Tereso Newcomer, PA-C    12/30/2022 2:02 PM

## 2023-01-15 ENCOUNTER — Other Ambulatory Visit: Payer: Self-pay | Admitting: Cardiovascular Disease

## 2023-01-15 DIAGNOSIS — I2510 Atherosclerotic heart disease of native coronary artery without angina pectoris: Secondary | ICD-10-CM

## 2023-01-21 ENCOUNTER — Other Ambulatory Visit: Payer: Self-pay | Admitting: Cardiovascular Disease

## 2023-02-01 ENCOUNTER — Encounter: Payer: 59 | Admitting: Podiatry

## 2023-02-02 NOTE — Progress Notes (Signed)
Patient was a no-show for today's scheduled appointment

## 2023-04-08 ENCOUNTER — Ambulatory Visit
Admission: RE | Admit: 2023-04-08 | Discharge: 2023-04-08 | Disposition: A | Discharge status: Home / Self Care | Payer: 59 | Source: Ambulatory Visit | Attending: Student | Admitting: Student

## 2023-04-08 ENCOUNTER — Other Ambulatory Visit: Payer: Self-pay | Admitting: Student

## 2023-04-08 DIAGNOSIS — M79621 Pain in right upper arm: Secondary | ICD-10-CM

## 2023-04-08 DIAGNOSIS — M25511 Pain in right shoulder: Secondary | ICD-10-CM

## 2023-04-13 ENCOUNTER — Other Ambulatory Visit: Payer: Self-pay | Admitting: Cardiovascular Disease

## 2023-08-13 LAB — COLOGUARD: COLOGUARD: NEGATIVE

## 2023-12-08 ENCOUNTER — Other Ambulatory Visit: Payer: Self-pay | Admitting: Physician Assistant

## 2024-01-14 ENCOUNTER — Other Ambulatory Visit: Payer: Self-pay | Admitting: Physician Assistant

## 2024-01-19 ENCOUNTER — Other Ambulatory Visit: Payer: Self-pay | Admitting: Cardiovascular Disease

## 2024-01-19 DIAGNOSIS — I2510 Atherosclerotic heart disease of native coronary artery without angina pectoris: Secondary | ICD-10-CM

## 2024-02-01 ENCOUNTER — Ambulatory Visit: Attending: Cardiology | Admitting: Cardiovascular Disease

## 2024-02-01 ENCOUNTER — Encounter: Payer: Self-pay | Admitting: Cardiovascular Disease

## 2024-02-01 VITALS — BP 121/71 | HR 83 | Ht 73.0 in | Wt 185.2 lb

## 2024-02-01 DIAGNOSIS — E7800 Pure hypercholesterolemia, unspecified: Secondary | ICD-10-CM

## 2024-02-01 DIAGNOSIS — I739 Peripheral vascular disease, unspecified: Secondary | ICD-10-CM

## 2024-02-01 DIAGNOSIS — I2510 Atherosclerotic heart disease of native coronary artery without angina pectoris: Secondary | ICD-10-CM | POA: Diagnosis not present

## 2024-02-01 DIAGNOSIS — I10 Essential (primary) hypertension: Secondary | ICD-10-CM | POA: Diagnosis not present

## 2024-02-01 DIAGNOSIS — R600 Localized edema: Secondary | ICD-10-CM

## 2024-02-01 MED ORDER — NITROGLYCERIN 0.4 MG SL SUBL: 0.4000 mg | SUBLINGUAL_TABLET | SUBLINGUAL | 3 refills | Status: AC | PRN

## 2024-02-01 MED ORDER — AMLODIPINE BESYLATE 5 MG PO TABS: 5.0000 mg | ORAL_TABLET | Freq: Every day | ORAL | 3 refills | Status: AC

## 2024-02-01 MED ORDER — POTASSIUM CHLORIDE CRYS ER 20 MEQ PO TBCR: 20.0000 meq | EXTENDED_RELEASE_TABLET | Freq: Every day | ORAL | 3 refills | Status: AC

## 2024-02-01 NOTE — Progress Notes (Signed)
 Cardiology Office Note:    Date:  02/01/2024   ID:  [name], DOB 1950-06-15, MRN [id number]  PCP:  Maree Leni Edyth DELENA, MD   [facility] Providers Cardiologist:  Ozell Fell, MD     Referring MD: Maree Leni Edyth DELENA, MD   Chief Complaint  Patient presents with   Coronary Artery Disease    History of Present Illness:    [name] is a 73 y.o. male with a hx of:  Coronary artery disease  S/p NSTEMI in 10/2007 tx with DES to RCA and LCx LHC 04/2011: mLAD 40%, RI 50-60%, oCFX 50-60%, pCFX stent ok, OM1 70%, RCA stents ok with 20-30% before stent and in dRCA, EF 55-65%, no change from 2009 and medical tx continued.  TTE 05/22/2014: EF 60-65, no RWMA Carotid US  05/23/2014: Bilateral ICA 1-39 Hypertension Hyperlipidemia Chronic Obstructive Pulmonary Disease   The patient is here with his wife today.  He has been doing pretty well from a cardiac perspective.  He does note some exertional dyspnea but states this has not changed in many years.  He has COPD and quit smoking about 18 years ago.  He denies orthopnea or PND.  He has also developed ankle swelling bilaterally.  No pretibial edema.  No chest pain or pressure.  No other complaints at this time.  He is receiving his primary care through [facility].  Current Medications: Current Meds  Medication Sig   allopurinol  (ZYLOPRIM ) 100 MG tablet Take 1 tablet (100 mg total) by mouth daily. Start taking 2 weeks after gout flare resolved.   amLODipine  (NORVASC ) 10 MG tablet Take 1 tablet (10 mg total) by mouth daily.   aspirin  81 MG chewable tablet Chew 81 mg by mouth as needed.   atorvastatin  (LIPITOR) 40 MG tablet TAKE 1 TABLET BY MOUTH EVERY DAY   carvedilol  (COREG ) 6.25 MG tablet Take 1 tablet (6.25 mg total) by mouth 2 (two) times daily with a meal.   clopidogrel  (PLAVIX ) 75 MG tablet Take 1 tablet by mouth once daily   Colchicine  (MITIGARE ) 0.6 MG CAPS Take 1 capsule by mouth once for 1 dose.   gabapentin  (NEURONTIN) 300 MG capsule Take 300 mg by mouth.   HYDROcodone -acetaminophen  (NORCO) 5-325 MG tablet Take 1 tablet by mouth every 4 (four) hours as needed for moderate pain.   levothyroxine  (LEVOTHROID) 25 MCG tablet Take 1 tablet (25 mcg total) by mouth daily before breakfast.   lisinopril  (ZESTRIL ) 40 MG tablet Take 1 tablet (40 mg total) by mouth daily.   nitroGLYCERIN  (NITROSTAT ) 0.4 MG SL tablet Place 1 tablet (0.4 mg total) under the tongue every 5 (five) minutes x 3 doses as needed. For shortness of breath   pantoprazole  (PROTONIX ) 40 MG tablet TAKE 1 TABLET BY MOUTH EVERY DAY   potassium chloride  SA (KLOR-CON  M) 20 MEQ tablet Take 1 tablet (20 mEq total) by mouth daily. PATIENT MUST CALL AND SCHEDULE ANNUAL APPOINTMENT FOR FURTHER REFILLS SECOND ATTEMPT   tadalafil  (CIALIS ) 10 MG tablet TAKE 1 TABLET BY MOUTH ONCE DAILY AS NEEDED FOR ERECTILE DYSFUNCTION . APPOINTMENT REQUIRED FOR FUTURE REFILLS     Allergies:   Patient has no known allergies.   ROS:   Please see the history of present illness.    All other systems reviewed and are negative.  EKGs/Labs/Other Studies Reviewed:    The following studies were reviewed today:     EKG:   EKG Interpretation Date/Time:  Wednesday February 01 2024 10:20:59 EDT Ventricular Rate:  83 PR Interval:  200 QRS Duration:  94 QT Interval:  392 QTC Calculation: 460 R Axis:   -12  Text Interpretation: Normal sinus rhythm Cannot rule out Inferior infarct , age undetermined When compared with ECG of 03-Dec-2022 08:28, No significant change was found Confirmed by Wonda Sharper [phone]) on 02/01/2024 10:25:51 AM    Recent Labs: No results found for requested labs within last 365 days.  Recent Lipid Panel    Component Value Date/Time   CHOL 128 12/03/2022 0905   TRIG 105 12/03/2022 0905   HDL 57 12/03/2022 0905   CHOLHDL 2.2 12/03/2022 0905   CHOLHDL 2.3 07/28/2015 0829   VLDL 28 07/28/2015 0829   LDLCALC 52 12/03/2022 0905   LDLDIRECT  125.6 01/31/2013 1149     Risk Assessment/Calculations:                Physical Exam:    VS:  BP 121/71   Pulse 83   Ht 6' 1 (1.854 m)   Wt 185 lb 3.2 oz (84 kg)   SpO2 93%   BMI 24.43 kg/m     Wt Readings from Last 3 Encounters:  02/01/24 185 lb 3.2 oz (84 kg)  12/03/22 197 lb (89.4 kg)  12/16/21 180 lb (81.6 kg)     GEN:  Well nourished, well developed in no acute distress HEENT: Normal NECK: No JVD; No carotid bruits LYMPHATICS: No lymphadenopathy CARDIAC: RRR, no murmurs, rubs, gallops RESPIRATORY:  Clear to auscultation without rales, wheezing or rhonchi  ABDOMEN: Soft, non-tender, non-distended MUSCULOSKELETAL: 2+ bilateral ankle edema; No deformity  SKIN: Warm and dry NEUROLOGIC:  Alert and oriented x 3 PSYCHIATRIC:  Normal affect   Assessment & Plan Coronary artery disease involving native coronary artery of native heart without angina pectoris Patient stable with no symptoms of angina.  Continue clopidogrel  75 mg daily.  Continue carvedilol .  Continue atorvastatin .  Follow-up 1 year. Essential hypertension Blood pressure is well-controlled on amlodipine , carvedilol , and lisinopril .  I recommended reducing amlodipine  to 5 mg daily to focus on improving his ankle swelling.  This has the typical appearance of amlodipine  related swelling.  He does not have any signs or symptoms of heart failure. Pure hypercholesterolemia Treated with atorvastatin  40 mg daily.  Last lipids with an LDL cholesterol of 52.  Continue the same. Atherosclerosis of native coronary artery of native heart without angina pectoris As above PAD (peripheral artery disease) (HCC) Reviewed an outside ABI which was unable to be calculated because of noncompressible vessels.  Delayed pulse transmissions raised suspicion of more proximal disease.  I have recommended a lower extremity arterial duplex.  He does not really seem to have typical symptoms of chronic Claudication. Bilateral leg  edema Suspect amlodipine -related.  Reduce dose to 5 mg and follow clinically.            Medication Adjustments/Labs and Tests Ordered: Current medicines are reviewed at length with the patient today.  Concerns regarding medicines are outlined above.  Orders Placed This Encounter  Procedures   EKG 12-Lead   EKG 12-Lead   Meds ordered this encounter  Medications   nitroGLYCERIN  (NITROSTAT ) 0.4 MG SL tablet    Sig: Place 1 tablet (0.4 mg total) under the tongue every 5 (five) minutes x 3 doses as needed. For shortness of breath    Dispense:  25 tablet    Refill:  3   potassium chloride  SA (KLOR-CON  M) 20 MEQ tablet  Sig: Take 1 tablet (20 mEq total) by mouth daily. PATIENT MUST CALL AND SCHEDULE ANNUAL APPOINTMENT FOR FURTHER REFILLS SECOND ATTEMPT    Dispense:  90 tablet    Refill:  3    PATIENT MUST CALL AND SCHEDULE ANNUAL APPOINTMENT FOR FURTHER REFILLS SECOND ATTEMPT    There are no Patient Instructions on file for this visit.   Signed, Ozell Fell, MD  02/01/2024 10:41 AM    [facility]

## 2024-02-01 NOTE — Assessment & Plan Note (Signed)
 Treated with atorvastatin  40 mg daily.  Last lipids with an LDL cholesterol of 52.  Continue the same.

## 2024-02-01 NOTE — Patient Instructions (Signed)
 Medication Instructions:  DECREASE Amlodipine  to 5 mg once daily   *If you need a refill on your cardiac medications before your next appointment, please call your pharmacy*  Lab Work: Please have [facility] send us  your recent lab work at [phone] (fax #)  If you have labs (blood work) drawn today and your tests are completely normal, you will receive your results only by: MyChart Message (if you have MyChart) OR A paper copy in the mail If you have any lab test that is abnormal or we need to change your treatment, we will call you to review the results.  Testing/Procedures: Your physician has requested that you have an ankle brachial index (ABI). During this test an ultrasound and blood pressure cuff are used to evaluate the arteries that supply the arms and legs with blood. Allow thirty minutes for this exam. There are no restrictions or special instructions.  Please note: We ask at that you not bring children with you during ultrasound (echo/ vascular) testing. Due to room size and safety concerns, children are not allowed in the ultrasound rooms during exams. Our front office staff cannot provide observation of children in our lobby area while testing is being conducted. An adult accompanying a patient to their appointment will only be allowed in the ultrasound room at the discretion of the ultrasound technician under special circumstances. We apologize for any inconvenience.  Your physician has requested that you have a lower extremity arterial duplex. This test is an ultrasound of the arteries in the legs or arms. It looks at arterial blood flow in the legs and arms. Allow one hour for Lower and Upper Arterial scans. There are no restrictions or special instructions.  Please note: We ask at that you not bring children with you during ultrasound (echo/ vascular) testing. Due to room size and safety concerns, children are not allowed in the ultrasound rooms during exams. Our front  office staff cannot provide observation of children in our lobby area while testing is being conducted. An adult accompanying a patient to their appointment will only be allowed in the ultrasound room at the discretion of the ultrasound technician under special circumstances. We apologize for any inconvenience.    Follow-Up: At [facility], you and your health needs are our priority.  As part of our continuing mission to provide you with exceptional heart care, our providers are all part of one team.  This team includes your primary Cardiologist (physician) and Advanced Practice Providers or APPs (Physician Assistants and Nurse Practitioners) who all work together to provide you with the care you need, when you need it.  Your next appointment:   1 year(s)  Provider:   Ozell Fell, MD

## 2024-02-01 NOTE — Assessment & Plan Note (Signed)
 Blood pressure is well-controlled on amlodipine , carvedilol , and lisinopril .  I recommended reducing amlodipine  to 5 mg daily to focus on improving his ankle swelling.  This has the typical appearance of amlodipine  related swelling.  He does not have any signs or symptoms of heart failure.

## 2024-02-01 NOTE — Assessment & Plan Note (Signed)
 Patient stable with no symptoms of angina.  Continue clopidogrel  75 mg daily.  Continue carvedilol .  Continue atorvastatin .  Follow-up 1 year.

## 2024-02-28 ENCOUNTER — Ambulatory Visit (HOSPITAL_COMMUNITY)

## 2024-03-05 ENCOUNTER — Ambulatory Visit (HOSPITAL_COMMUNITY)
Admission: RE | Admit: 2024-03-05 | Discharge: 2024-03-05 | Disposition: A | Discharge status: Home / Self Care | Source: Ambulatory Visit | Attending: Cardiovascular Disease | Admitting: Cardiovascular Disease

## 2024-03-05 ENCOUNTER — Ambulatory Visit (HOSPITAL_BASED_OUTPATIENT_CLINIC_OR_DEPARTMENT_OTHER)
Admission: RE | Admit: 2024-03-05 | Discharge: 2024-03-05 | Disposition: A | Discharge status: Home / Self Care | Source: Ambulatory Visit | Attending: Cardiovascular Disease | Admitting: Cardiovascular Disease

## 2024-03-05 DIAGNOSIS — R600 Localized edema: Secondary | ICD-10-CM

## 2024-03-06 LAB — VAS US ABI WITH/WO TBI

## 2024-03-07 ENCOUNTER — Ambulatory Visit: Payer: Self-pay | Admitting: Physician Assistant

## 2024-03-20 ENCOUNTER — Encounter: Payer: Self-pay | Admitting: Cardiovascular Disease

## 2024-03-20 ENCOUNTER — Ambulatory Visit: Attending: Cardiovascular Disease | Admitting: Cardiovascular Disease

## 2024-03-20 VITALS — BP 138/74 | HR 88 | Ht 73.0 in | Wt 176.0 lb

## 2024-03-20 DIAGNOSIS — I10 Essential (primary) hypertension: Secondary | ICD-10-CM | POA: Diagnosis not present

## 2024-03-20 DIAGNOSIS — I739 Peripheral vascular disease, unspecified: Secondary | ICD-10-CM | POA: Diagnosis not present

## 2024-03-20 DIAGNOSIS — E785 Hyperlipidemia, unspecified: Secondary | ICD-10-CM

## 2024-03-20 DIAGNOSIS — I2510 Atherosclerotic heart disease of native coronary artery without angina pectoris: Secondary | ICD-10-CM

## 2024-03-20 LAB — CBC

## 2024-03-20 NOTE — Patient Instructions (Addendum)
 Medication Instructions:  No changes *If you need a refill on your cardiac medications before your next appointment, please call your pharmacy*  Lab Work: Your provider would like for you to have the following labs today: CBC and BMET  If you have labs (blood work) drawn today and your tests are completely normal, you will receive your results only by: MyChart Message (if you have MyChart) OR A paper copy in the mail If you have any lab test that is abnormal or we need to change your treatment, we will call you to review the results.  Testing/Procedures: Your physician has requested that you have a peripheral vascular angiogram. This exam is performed at the hospital. During this exam IV contrast is used to look at arterial blood flow. Please review the information sheet given for details.   Follow-Up: At [facility], you and your health needs are our priority.  As part of our continuing mission to provide you with exceptional heart care, our providers are all part of one team.  This team includes your primary Cardiologist (physician) and Advanced Practice Providers or APPs (Physician Assistants and Nurse Practitioners) who all work together to provide you with the care you need, when you need it.  Your next appointment:   4 weeks  Provider:   Dr. Arida or Hao Meng, PA-C        We recommend signing up for the patient portal called MyChart.  Sign up information is provided on this After Visit Summary.  MyChart is used to connect with patients for Virtual Visits (Telemedicine).  Patients are able to view lab/test results, encounter notes, upcoming appointments, etc.  Non-urgent messages can be sent to your provider as well.   To learn more about what you can do with MyChart, go to [email or web address].   Other Instructions  [facility] A DEPT OF [place] AT [address] A DEPT OF THE [place] [address] [place] [address] Dept: [phone] Loc: [phone]  GERRICK RAY  03/20/2024  You are scheduled for a Peripheral Angiogram on Wednesday, November 12 with Dr. Deatrice Cage.  1. Please arrive at the [facility] (Main Entrance A) at [facility]: [address] [place], [place] [address] at 6:30 AM (This time is 2 hour(s) before your procedure to ensure your preparation).   Free valet parking service is available. You will check in at ADMITTING. The support person will be asked to wait in the waiting room.  It is OK to have someone drop you off and come back when you are ready to be discharged.    Special note: Every effort is made to have your procedure done on time. Please understand that emergencies sometimes delay scheduled procedures.  2. Diet: Nothing to eat after midnight.   3. Hydration: You need to be well hydrated before your procedure. On November 12, you may drink approved liquids (see below) until 2 hours before the procedure, with 16 oz of water as your last intake.   List of approved liquids water, clear juice, clear tea, black coffee, fruit juices, non-citric and without pulp, carbonated beverages, Gatorade, Kool -Aid, plain Jello-O and plain ice popsicles.  4. Labs: You will need to have blood drawn on You do not need to be fasting.  5. Medication instructions in preparation for your procedure: Nothing to hold  On the morning of your procedure, take your Aspirin  81 mg and Plavix /Clopidogrel  and  any morning medicines NOT listed above.  You may use sips of water.  6. Plan to go home the same day, you will only stay overnight if medically necessary. 7. Bring a current list of your medications and current insurance cards. 8. You MUST have a responsible person to drive you home. 9. Someone MUST be with you the first 24 hours after you arrive home or your discharge will be delayed. 10. Please wear clothes that are easy to get on and off and wear slip-on  shoes.  Thank you for allowing us  to care for you!   -- [facility]

## 2024-03-20 NOTE — H&P (View-Only) (Signed)
 Cardiology Office Note   Date:  03/20/2024   ID:  [name], [name] 05-Oct-1950, MRN [id number]  PCP:  Maree Leni Edyth DELENA, MD  Cardiologist:  Dr. Wonda  No chief complaint on file.     History of Present Illness: [name] is a 73 y.o. male who was referred by Dr. Wonda for evaluation management of peripheral arterial disease. He has known history of coronary artery disease with previous non-STEMI and stent placement, carotid artery disease, essential hypertension, hyperlipidemia and COPD. He suffers from low back pain and had previous back surgery.  He had struggled with low back discomfort since then and he is limited because of that.  He has to use a walker most of the time.  He does report progressive bilateral calf discomfort and heaviness that has been progressive over the last year.  In addition, he feels that both legs are getting weaker.  He underwent noninvasive vascular studies last month which showed noncompressible vessels bilaterally with significantly abnormal toe pressure.  Duplex showed evidence of mid to distal right SFA occlusion and monophasic waveforms in the left SFA suggestive of more proximal disease.   Past Medical History:  Diagnosis Date   Angina    COPD (chronic obstructive pulmonary disease) (HCC)    Coronary artery disease    s/p NSTEMI 10/2007, RCA and LCx PCI with drug-eluting stents; patent stents cath 04/2011   DJD (degenerative joint disease)    Emphysema    Gout    Hypercholesterolemia    Hypertension    Poorly controlled   Myocardial infarction (HCC)    Pneumonia    Shortness of breath     Past Surgical History:  Procedure Laterality Date   CERVICAL DISC SURGERY     CORONARY ANGIOPLASTY WITH STENT PLACEMENT     LEFT HEART CATHETERIZATION WITH CORONARY ANGIOGRAM N/A 04/20/2011   Procedure: LEFT HEART CATHETERIZATION WITH CORONARY ANGIOGRAM;  Surgeon: Peter M Jordan, MD;  Location: [facility] CATH LAB;  Service: Cardiovascular;   Laterality: N/A;     Current Outpatient Medications  Medication Sig Dispense Refill   allopurinol  (ZYLOPRIM ) 100 MG tablet Take 1 tablet (100 mg total) by mouth daily. Start taking 2 weeks after gout flare resolved. 90 tablet 3   amLODipine  (NORVASC ) 5 MG tablet Take 1 tablet (5 mg total) by mouth daily. 90 tablet 3   aspirin  81 MG chewable tablet Chew 81 mg by mouth as needed.     atorvastatin  (LIPITOR) 40 MG tablet TAKE 1 TABLET BY MOUTH EVERY DAY 90 tablet 2   carvedilol  (COREG ) 6.25 MG tablet Take 1 tablet (6.25 mg total) by mouth 2 (two) times daily with a meal. 180 tablet 3   clopidogrel  (PLAVIX ) 75 MG tablet Take 1 tablet by mouth once daily 30 tablet 0   Colchicine  (MITIGARE ) 0.6 MG CAPS Take 1 capsule by mouth once for 1 dose. 90 capsule 3   gabapentin (NEURONTIN) 300 MG capsule Take 300 mg by mouth.     HYDROcodone -acetaminophen  (NORCO) 5-325 MG tablet Take 1 tablet by mouth every 4 (four) hours as needed for moderate pain. 20 tablet 0   levothyroxine  (LEVOTHROID) 25 MCG tablet Take 1 tablet (25 mcg total) by mouth daily before breakfast. 30 tablet 0   lisinopril  (ZESTRIL ) 40 MG tablet Take 1 tablet (40 mg total) by mouth daily. 90 tablet 2   nitroGLYCERIN  (NITROSTAT ) 0.4 MG SL tablet Place 1 tablet (0.4 mg total) under the tongue every 5 (five) minutes  x 3 doses as needed. For shortness of breath 25 tablet 3   pantoprazole  (PROTONIX ) 40 MG tablet TAKE 1 TABLET BY MOUTH EVERY DAY 90 tablet 3   potassium chloride  SA (KLOR-CON  M) 20 MEQ tablet Take 1 tablet (20 mEq total) by mouth daily. PATIENT MUST CALL AND SCHEDULE ANNUAL APPOINTMENT FOR FURTHER REFILLS SECOND ATTEMPT 90 tablet 3   tadalafil  (CIALIS ) 10 MG tablet TAKE 1 TABLET BY MOUTH ONCE DAILY AS NEEDED FOR ERECTILE DYSFUNCTION . APPOINTMENT REQUIRED FOR FUTURE REFILLS 90 tablet 1   No current facility-administered medications for this visit.    Allergies:   Patient has no known allergies.    Social History:  The patient   reports that he quit smoking about 18 years ago. His smoking use included cigarettes. He started smoking about 48 years ago. He has a 30 pack-year smoking history. He has never used smokeless tobacco. He reports current alcohol use of about 3.0 standard drinks of alcohol per week. He reports that he does not use drugs.   Family History:  The patient's family history includes Heart attack (age of onset: 69) in his mother; Hypertension in his father, sister, and sister; Pneumonia in his father.    ROS:  Please see the history of present illness.   Otherwise, review of systems are positive for none.   All other systems are reviewed and negative.    PHYSICAL EXAM: VS:  BP 138/74 (BP Location: Left Arm, Patient Position: Sitting)   Pulse 88   Ht 6' 1 (1.854 m)   Wt 176 lb (79.8 kg)   SpO2 97%   BMI 23.22 kg/m  , BMI Body mass index is 23.22 kg/m. GEN: Well nourished, well developed, in no acute distress  HEENT: normal  Neck: no JVD, carotid bruits, or masses Cardiac: RRR; no  rubs, or gallops .  2 out of 6 systolic murmur in the aortic area.  Mild bilateral lower extremity edema Respiratory:  clear to auscultation bilaterally, normal work of breathing GI: soft, nontender, nondistended, + BS MS: no deformity or atrophy  Skin: warm and dry, no rash Neuro:  Strength and sensation are intact Psych: euthymic mood, full affect Vascular: Pulses: +2 bilaterally.  Femoral: +1 on the right side and absent on the left with a bruit.  Distal pulses are not palpable.   EKG:  EKG is not ordered today.    Recent Labs: No results found for requested labs within last 365 days.    Lipid Panel    Component Value Date/Time   CHOL 128 12/03/2022 0905   TRIG 105 12/03/2022 0905   HDL 57 12/03/2022 0905   CHOLHDL 2.2 12/03/2022 0905   CHOLHDL 2.3 07/28/2015 0829   VLDL 28 07/28/2015 0829   LDLCALC 52 12/03/2022 0905   LDLDIRECT 125.6 01/31/2013 1149      Wt Readings from Last 3 Encounters:   03/20/24 176 lb (79.8 kg)  02/01/24 185 lb 3.2 oz (84 kg)  12/03/22 197 lb (89.4 kg)           No data to display            ASSESSMENT AND PLAN:  1.  Peripheral arterial disease: The patient seems to have severe bilateral leg claudication.  He has chronic low back pain with previous back surgery which makes it harder to distinguish the etiology.  However, his vascular exam is very concerning with absent left femoral pulse and very diminished right femoral pulse.  This is highly  suggestive of bilateral inflow disease which is likely contributing to his low back pain as well.  His toe pressure was very low especially on the left.  He is not able to participate in structured exercise therapy program. I recommend proceeding with abdominal aortogram with lower extremity angiography and possible endovascular intervention.  I discussed the procedure in details as well as risks and benefits.  Planned access is via the right common femoral artery.  He is already on dual antiplatelet therapy.  2.  Coronary artery disease involving native coronary arteries without angina: He is stable from a cardiac standpoint.  3.  Essential hypertension: Blood pressure is well-controlled on current medications.  4.  Hyperlipidemia: Most recent lipid profile showed an LDL of 52.  Continue treatment with atorvastatin .    Disposition:   Proceed with abdominal aortogram with lower extremity angiography and follow-up after.  Signed,  Deatrice Cage, MD  03/20/2024 8:55 AM    [facility]

## 2024-03-20 NOTE — Progress Notes (Unsigned)
 Cardiology Office Note   Date:  03/20/2024   ID:  Andrew, [name] 05-Oct-1950, MRN [id number]  PCP:  Maree Leni Edyth DELENA, MD  Cardiologist:  Dr. Wonda  No chief complaint on file.     History of Present Illness: [name] is a 73 y.o. male who was referred by Dr. Wonda for evaluation management of peripheral arterial disease. He has known history of coronary artery disease with previous non-STEMI and stent placement, carotid artery disease, essential hypertension, hyperlipidemia and COPD. He suffers from low back pain and had previous back surgery.  He had struggled with low back discomfort since then and he is limited because of that.  He has to use a walker most of the time.  He does report progressive bilateral calf discomfort and heaviness that has been progressive over the last year.  In addition, he feels that both legs are getting weaker.  He underwent noninvasive vascular studies last month which showed noncompressible vessels bilaterally with significantly abnormal toe pressure.  Duplex showed evidence of mid to distal right SFA occlusion and monophasic waveforms in the left SFA suggestive of more proximal disease.   Past Medical History:  Diagnosis Date   Angina    COPD (chronic obstructive pulmonary disease) (HCC)    Coronary artery disease    s/p NSTEMI 10/2007, RCA and LCx PCI with drug-eluting stents; patent stents cath 04/2011   DJD (degenerative joint disease)    Emphysema    Gout    Hypercholesterolemia    Hypertension    Poorly controlled   Myocardial infarction (HCC)    Pneumonia    Shortness of breath     Past Surgical History:  Procedure Laterality Date   CERVICAL DISC SURGERY     CORONARY ANGIOPLASTY WITH STENT PLACEMENT     LEFT HEART CATHETERIZATION WITH CORONARY ANGIOGRAM N/A 04/20/2011   Procedure: LEFT HEART CATHETERIZATION WITH CORONARY ANGIOGRAM;  Surgeon: Peter M Jordan, MD;  Location: [facility] CATH LAB;  Service: Cardiovascular;   Laterality: N/A;     Current Outpatient Medications  Medication Sig Dispense Refill   allopurinol  (ZYLOPRIM ) 100 MG tablet Take 1 tablet (100 mg total) by mouth daily. Start taking 2 weeks after gout flare resolved. 90 tablet 3   amLODipine  (NORVASC ) 5 MG tablet Take 1 tablet (5 mg total) by mouth daily. 90 tablet 3   aspirin  81 MG chewable tablet Chew 81 mg by mouth as needed.     atorvastatin  (LIPITOR) 40 MG tablet TAKE 1 TABLET BY MOUTH EVERY DAY 90 tablet 2   carvedilol  (COREG ) 6.25 MG tablet Take 1 tablet (6.25 mg total) by mouth 2 (two) times daily with a meal. 180 tablet 3   clopidogrel  (PLAVIX ) 75 MG tablet Take 1 tablet by mouth once daily 30 tablet 0   Colchicine  (MITIGARE ) 0.6 MG CAPS Take 1 capsule by mouth once for 1 dose. 90 capsule 3   gabapentin (NEURONTIN) 300 MG capsule Take 300 mg by mouth.     HYDROcodone -acetaminophen  (NORCO) 5-325 MG tablet Take 1 tablet by mouth every 4 (four) hours as needed for moderate pain. 20 tablet 0   levothyroxine  (LEVOTHROID) 25 MCG tablet Take 1 tablet (25 mcg total) by mouth daily before breakfast. 30 tablet 0   lisinopril  (ZESTRIL ) 40 MG tablet Take 1 tablet (40 mg total) by mouth daily. 90 tablet 2   nitroGLYCERIN  (NITROSTAT ) 0.4 MG SL tablet Place 1 tablet (0.4 mg total) under the tongue every 5 (five) minutes  x 3 doses as needed. For shortness of breath 25 tablet 3   pantoprazole  (PROTONIX ) 40 MG tablet TAKE 1 TABLET BY MOUTH EVERY DAY 90 tablet 3   potassium chloride  SA (KLOR-CON  M) 20 MEQ tablet Take 1 tablet (20 mEq total) by mouth daily. PATIENT MUST CALL AND SCHEDULE ANNUAL APPOINTMENT FOR FURTHER REFILLS SECOND ATTEMPT 90 tablet 3   tadalafil  (CIALIS ) 10 MG tablet TAKE 1 TABLET BY MOUTH ONCE DAILY AS NEEDED FOR ERECTILE DYSFUNCTION . APPOINTMENT REQUIRED FOR FUTURE REFILLS 90 tablet 1   No current facility-administered medications for this visit.    Allergies:   Patient has no known allergies.    Social History:  The patient   reports that he quit smoking about 18 years ago. His smoking use included cigarettes. He started smoking about 48 years ago. He has a 30 pack-year smoking history. He has never used smokeless tobacco. He reports current alcohol use of about 3.0 standard drinks of alcohol per week. He reports that he does not use drugs.   Family History:  The patient's family history includes Heart attack (age of onset: 69) in his mother; Hypertension in his father, sister, and sister; Pneumonia in his father.    ROS:  Please see the history of present illness.   Otherwise, review of systems are positive for none.   All other systems are reviewed and negative.    PHYSICAL EXAM: VS:  BP 138/74 (BP Location: Left Arm, Patient Position: Sitting)   Pulse 88   Ht 6' 1 (1.854 m)   Wt 176 lb (79.8 kg)   SpO2 97%   BMI 23.22 kg/m  , BMI Body mass index is 23.22 kg/m. GEN: Well nourished, well developed, in no acute distress  HEENT: normal  Neck: no JVD, carotid bruits, or masses Cardiac: RRR; no  rubs, or gallops .  2 out of 6 systolic murmur in the aortic area.  Mild bilateral lower extremity edema Respiratory:  clear to auscultation bilaterally, normal work of breathing GI: soft, nontender, nondistended, + BS MS: no deformity or atrophy  Skin: warm and dry, no rash Neuro:  Strength and sensation are intact Psych: euthymic mood, full affect Vascular: Pulses: +2 bilaterally.  Femoral: +1 on the right side and absent on the left with a bruit.  Distal pulses are not palpable.   EKG:  EKG is not ordered today.    Recent Labs: No results found for requested labs within last 365 days.    Lipid Panel    Component Value Date/Time   CHOL 128 12/03/2022 0905   TRIG 105 12/03/2022 0905   HDL 57 12/03/2022 0905   CHOLHDL 2.2 12/03/2022 0905   CHOLHDL 2.3 07/28/2015 0829   VLDL 28 07/28/2015 0829   LDLCALC 52 12/03/2022 0905   LDLDIRECT 125.6 01/31/2013 1149      Wt Readings from Last 3 Encounters:   03/20/24 176 lb (79.8 kg)  02/01/24 185 lb 3.2 oz (84 kg)  12/03/22 197 lb (89.4 kg)           No data to display            ASSESSMENT AND PLAN:  1.  Peripheral arterial disease: The patient seems to have severe bilateral leg claudication.  He has chronic low back pain with previous back surgery which makes it harder to distinguish the etiology.  However, his vascular exam is very concerning with absent left femoral pulse and very diminished right femoral pulse.  This is highly  suggestive of bilateral inflow disease which is likely contributing to his low back pain as well.  His toe pressure was very low especially on the left.  He is not able to participate in structured exercise therapy program. I recommend proceeding with abdominal aortogram with lower extremity angiography and possible endovascular intervention.  I discussed the procedure in details as well as risks and benefits.  Planned access is via the right common femoral artery.  He is already on dual antiplatelet therapy.  2.  Coronary artery disease involving native coronary arteries without angina: He is stable from a cardiac standpoint.  3.  Essential hypertension: Blood pressure is well-controlled on current medications.  4.  Hyperlipidemia: Most recent lipid profile showed an LDL of 52.  Continue treatment with atorvastatin .    Disposition:   Proceed with abdominal aortogram with lower extremity angiography and follow-up after.  Signed,  Deatrice Cage, MD  03/20/2024 8:55 AM    [facility]

## 2024-03-21 ENCOUNTER — Ambulatory Visit: Payer: Self-pay | Admitting: *Deleted

## 2024-03-21 LAB — BASIC METABOLIC PANEL WITH GFR
BUN/Creatinine Ratio: 11 (ref 10–24)
BUN: 11 mg/dL (ref 8–27)
CO2: 20 mmol/L (ref 20–29)
Calcium: 10 mg/dL (ref 8.6–10.2)
Chloride: 105 mmol/L (ref 96–106)
Creatinine, Ser: 1 mg/dL (ref 0.76–1.27)
Glucose: 97 mg/dL (ref 70–99)
Potassium: 4.4 mmol/L (ref 3.5–5.2)
Sodium: 143 mmol/L (ref 134–144)
eGFR: 80 mL/min/1.73 (ref >59)

## 2024-03-21 LAB — CBC
Hematocrit: 38.3 % (ref 37.5–51.0)
Hemoglobin: 12.2 g/dL — AB (ref 13.0–17.7)
MCH: 26.4 pg — AB (ref 26.6–33.0)
MCHC: 31.9 g/dL (ref 31.5–35.7)
MCV: 83 fL (ref 79–97)
Platelets: 235 x10E3/uL (ref 150–450)
RBC: 4.62 x10E6/uL (ref 4.14–5.80)
RDW: 16.2 % — AB (ref 11.6–15.4)
WBC: 4 x10E3/uL (ref 3.4–10.8)

## 2024-03-26 ENCOUNTER — Telehealth: Payer: Self-pay | Admitting: *Deleted

## 2024-03-26 NOTE — Telephone Encounter (Signed)
 [name] scheduled at [facility] for: Wednesday March 28, 2024 8:30 AM Arrival time [facility] Main Entrance A at: 6:30 AM  Diet: -Nothing to eat after midnight.  Hydration: -May drink clear liquids until 2 hours before the procedure.  Approved liquids: Water, clear tea, black coffee, fruit juices-non-citric and without pulp,Gatorade, plain Jello/popsicles.   -Please drink 16 oz of water 2 hours before procedure.  Medication instructions: -Usual morning medications can be taken including aspirin  81 mg and Plavix  75 mg  Plan to go home the same day, you will only stay overnight if medically necessary.  You must have responsible adult to drive you home.  Someone must be with you the first 24 hours after you arrive home.  Reviewed procedure instructions with patient.

## 2024-03-28 ENCOUNTER — Ambulatory Visit (HOSPITAL_COMMUNITY)
Admission: RE | Admit: 2024-03-28 | Discharge: 2024-03-28 | Disposition: A | Discharge status: Home / Self Care | Attending: Cardiovascular Disease | Admitting: Cardiovascular Disease

## 2024-03-28 ENCOUNTER — Encounter (HOSPITAL_COMMUNITY)
Admission: RE | Disposition: A | Discharge status: Home / Self Care | Payer: Self-pay | Source: Home / Self Care | Attending: Cardiovascular Disease

## 2024-03-28 DIAGNOSIS — M545 Low back pain, unspecified: Secondary | ICD-10-CM | POA: Insufficient documentation

## 2024-03-28 DIAGNOSIS — Z955 Presence of coronary angioplasty implant and graft: Secondary | ICD-10-CM | POA: Insufficient documentation

## 2024-03-28 DIAGNOSIS — I70212 Atherosclerosis of native arteries of extremities with intermittent claudication, left leg: Secondary | ICD-10-CM | POA: Diagnosis not present

## 2024-03-28 DIAGNOSIS — I70213 Atherosclerosis of native arteries of extremities with intermittent claudication, bilateral legs: Secondary | ICD-10-CM | POA: Diagnosis present

## 2024-03-28 DIAGNOSIS — Z7902 Long term (current) use of antithrombotics/antiplatelets: Secondary | ICD-10-CM | POA: Insufficient documentation

## 2024-03-28 DIAGNOSIS — I10 Essential (primary) hypertension: Secondary | ICD-10-CM | POA: Insufficient documentation

## 2024-03-28 DIAGNOSIS — Z79899 Other long term (current) drug therapy: Secondary | ICD-10-CM | POA: Insufficient documentation

## 2024-03-28 DIAGNOSIS — I252 Old myocardial infarction: Secondary | ICD-10-CM | POA: Diagnosis not present

## 2024-03-28 DIAGNOSIS — E785 Hyperlipidemia, unspecified: Secondary | ICD-10-CM | POA: Insufficient documentation

## 2024-03-28 DIAGNOSIS — J449 Chronic obstructive pulmonary disease, unspecified: Secondary | ICD-10-CM | POA: Diagnosis not present

## 2024-03-28 DIAGNOSIS — Z87891 Personal history of nicotine dependence: Secondary | ICD-10-CM | POA: Insufficient documentation

## 2024-03-28 DIAGNOSIS — I739 Peripheral vascular disease, unspecified: Secondary | ICD-10-CM

## 2024-03-28 DIAGNOSIS — Z8249 Family history of ischemic heart disease and other diseases of the circulatory system: Secondary | ICD-10-CM | POA: Diagnosis not present

## 2024-03-28 DIAGNOSIS — I714 Abdominal aortic aneurysm, without rupture, unspecified: Secondary | ICD-10-CM | POA: Insufficient documentation

## 2024-03-28 DIAGNOSIS — I2510 Atherosclerotic heart disease of native coronary artery without angina pectoris: Secondary | ICD-10-CM | POA: Insufficient documentation

## 2024-03-28 DIAGNOSIS — I708 Atherosclerosis of other arteries: Secondary | ICD-10-CM | POA: Insufficient documentation

## 2024-03-28 DIAGNOSIS — I779 Disorder of arteries and arterioles, unspecified: Secondary | ICD-10-CM | POA: Diagnosis not present

## 2024-03-28 HISTORY — PX: LOWER EXTREMITY INTERVENTION: CATH118252

## 2024-03-28 HISTORY — PX: LOWER EXTREMITY ANGIOGRAPHY: CATH118251

## 2024-03-28 SURGERY — LOWER EXTREMITY ANGIOGRAPHY
Anesthesia: LOCAL

## 2024-03-28 MED ORDER — SODIUM CHLORIDE 0.9% FLUSH: 3.0000 mL | Freq: Two times a day (BID) | INTRAVENOUS | Status: DC

## 2024-03-28 MED ORDER — SODIUM CHLORIDE 0.9 % IV SOLN: 250.0000 mL | INTRAVENOUS | Status: DC | PRN

## 2024-03-28 MED ORDER — LIDOCAINE HCL (PF) 1 % IJ SOLN
INTRAMUSCULAR | Status: AC
  Filled 2024-03-28: qty 30

## 2024-03-28 MED ORDER — SODIUM CHLORIDE 0.9% FLUSH: 3.0000 mL | INTRAVENOUS | Status: DC | PRN

## 2024-03-28 MED ORDER — FENTANYL CITRATE (PF) 100 MCG/2ML IJ SOLN
INTRAMUSCULAR | Status: DC | PRN
  Administered 2024-03-28: 25 ug via INTRAVENOUS

## 2024-03-28 MED ORDER — ASPIRIN 81 MG PO CHEW: 81.0000 mg | CHEWABLE_TABLET | ORAL | Status: DC

## 2024-03-28 MED ORDER — ACETAMINOPHEN 325 MG PO TABS: 650.0000 mg | ORAL_TABLET | ORAL | Status: DC | PRN

## 2024-03-28 MED ORDER — ONDANSETRON HCL 4 MG/2ML IJ SOLN: 4.0000 mg | Freq: Four times a day (QID) | INTRAMUSCULAR | Status: DC | PRN

## 2024-03-28 MED ORDER — LABETALOL HCL 5 MG/ML IV SOLN: 10.0000 mg | INTRAVENOUS | Status: DC | PRN

## 2024-03-28 MED ORDER — FREE WATER: 500.0000 mL | Freq: Once | Status: DC

## 2024-03-28 MED ORDER — MIDAZOLAM HCL 2 MG/2ML IJ SOLN
INTRAMUSCULAR | Status: AC
  Filled 2024-03-28: qty 2

## 2024-03-28 MED ORDER — HEPARIN SODIUM (PORCINE) 1000 UNIT/ML IJ SOLN
INTRAMUSCULAR | Status: DC | PRN
  Administered 2024-03-28: 7000 [IU] via INTRAVENOUS

## 2024-03-28 MED ORDER — MIDAZOLAM HCL (PF) 2 MG/2ML IJ SOLN
INTRAMUSCULAR | Status: DC | PRN
  Administered 2024-03-28: 1 mg via INTRAVENOUS

## 2024-03-28 MED ORDER — HEPARIN (PORCINE) IN NACL 1000-0.9 UT/500ML-% IV SOLN
INTRAVENOUS | Status: DC | PRN
  Administered 2024-03-28 (×2): 500 mL

## 2024-03-28 MED ORDER — IODIXANOL 320 MG/ML IV SOLN
INTRAVENOUS | Status: DC | PRN
  Administered 2024-03-28: 105 mL

## 2024-03-28 MED ORDER — LIDOCAINE HCL (PF) 1 % IJ SOLN
INTRAMUSCULAR | Status: DC | PRN
  Administered 2024-03-28: 15 mL

## 2024-03-28 MED ORDER — FENTANYL CITRATE (PF) 100 MCG/2ML IJ SOLN
INTRAMUSCULAR | Status: AC
  Filled 2024-03-28: qty 2

## 2024-03-28 MED ORDER — SODIUM CHLORIDE 0.9 % IV SOLN: INTRAVENOUS | Status: DC

## 2024-03-28 SURGICAL SUPPLY — 20 items
BAG SNAP BAND KOVER 36X36 (MISCELLANEOUS) IMPLANT
BALLOON MUSTANG 8X20X135 (BALLOONS) IMPLANT
BALLOON MUSTANG 9X20X75 (BALLOONS) IMPLANT
CATH ANGIO 5F PIGTAIL 65CM (CATHETERS) IMPLANT
CATH CROSS OVER TEMPO 5F (CATHETERS) IMPLANT
CATH TEMPO AQUA 5F 100CM (CATHETERS) IMPLANT
CLOSURE PERCLOSE PROSTYLE (Vascular Products) IMPLANT
GLIDEWIRE ADV .035X260CM (WIRE) IMPLANT
KIT ENCORE 26 ADVANTAGE (KITS) IMPLANT
KIT MICROPUNCTURE NIT STIFF (SHEATH) IMPLANT
KIT SINGLE USE MANIFOLD (KITS) IMPLANT
KIT SYRINGE INJ CVI SPIKEX1 (MISCELLANEOUS) IMPLANT
SET ATX-X65L (MISCELLANEOUS) IMPLANT
SHEATH CATAPULT 7FR 45 (SHEATH) IMPLANT
SHEATH PINNACLE 5F 10CM (SHEATH) IMPLANT
SHEATH PINNACLE 7F 10CM (SHEATH) IMPLANT
SHEATH PROBE COVER 6X72 (BAG) IMPLANT
STENT ABSOLUTE 9X40X135 (Permanent Stent) IMPLANT
TRAY PV CATH (CUSTOM PROCEDURE TRAY) ×2 IMPLANT
WIRE HITORQ VERSACORE ST 145CM (WIRE) IMPLANT

## 2024-03-28 NOTE — Progress Notes (Signed)
 Patient and patient wife given discharge instructions, education provided no further questions at this time. Patient able to ambulate and void before discharge. Able to tolerate PO intake. Patient site is clean, dry, intact with no hematoma noted upon discharge.

## 2024-03-28 NOTE — Interval H&P Note (Signed)
 History and Physical Interval Note:  03/28/2024 8:46 AM  [name]  has presented today for surgery, with the diagnosis of pad.  The various methods of treatment have been discussed with the patient and family. After consideration of risks, benefits and other options for treatment, the patient has consented to  Procedure(s): Lower Extremity Angiography (N/A) as a surgical intervention.  The patient's history has been reviewed, patient examined, no change in status, stable for surgery.  I have reviewed the patient's chart and labs.  Questions were answered to the patient's satisfaction.     [name]

## 2024-03-29 ENCOUNTER — Encounter (HOSPITAL_COMMUNITY): Payer: Self-pay | Admitting: Cardiovascular Disease

## 2024-03-29 ENCOUNTER — Other Ambulatory Visit: Payer: Self-pay | Admitting: *Deleted

## 2024-03-29 DIAGNOSIS — I739 Peripheral vascular disease, unspecified: Secondary | ICD-10-CM

## 2024-03-29 LAB — POCT ACTIVATED CLOTTING TIME: Activated Clotting Time: 250 s

## 2024-04-20 ENCOUNTER — Ambulatory Visit: Attending: Physician Assistant | Admitting: Physician Assistant

## 2024-04-20 ENCOUNTER — Encounter: Payer: Self-pay | Admitting: Physician Assistant

## 2024-04-20 VITALS — BP 104/66 | HR 88 | Ht 73.0 in | Wt 174.0 lb

## 2024-04-20 DIAGNOSIS — I739 Peripheral vascular disease, unspecified: Secondary | ICD-10-CM

## 2024-04-20 DIAGNOSIS — E785 Hyperlipidemia, unspecified: Secondary | ICD-10-CM

## 2024-04-20 DIAGNOSIS — I2510 Atherosclerotic heart disease of native coronary artery without angina pectoris: Secondary | ICD-10-CM | POA: Diagnosis not present

## 2024-04-20 DIAGNOSIS — I10 Essential (primary) hypertension: Secondary | ICD-10-CM

## 2024-04-20 MED ORDER — CLOPIDOGREL BISULFATE 75 MG PO TABS: 75.0000 mg | ORAL_TABLET | Freq: Every day | ORAL | 3 refills | Status: AC

## 2024-04-20 NOTE — Progress Notes (Signed)
 Cardiology Office Note   Date:  04/20/2024  ID:  Dezmon, Conover 1950/10/08, MRN [id number] PCP: Maree Leni Edyth DELENA, MD  [facility] Providers Cardiologist:  Ozell Fell, MD     History of Present Illness MACLEAN FOISTER is a 73 y.o. male with past medical history of CAD, carotid artery disease, PAD, COPD, hypertension and hyperlipidemia.  Patient had NSTEMI in June 2009 and underwent DES to RCA in the left circumflex artery.  Repeat cardiac catheterization in December 2012 revealed 50 to 60% RI, 40% mid LAD, 50 to 60% ostial left circumflex, patent proximal left circumflex stent, 70% OM1 lesion.  Patent RCA stent.  No change from 2009, medical therapy was recommended.  Echocardiogram in January 2016 showed EF 60 to 65%, no regional wall motion abnormality.  Carotid Doppler in January 2016 showed 1 to 39% bilateral disease.  Recent ABI and LDA obtained in October 2025 showed noncompressible artery, monophasic waveform suggestive of short segment occlusion in the mid to distal right SFA which reconstitutes in the distal segment via collateral artery, monophasic flow distally.  Left lower extremity arterial Doppler showed monophasic flow from left SFA to pedal arteries.  Patient was referred to Dr. Darron, patient was not able to participate in a structured exercise program, Dr. Darron recommend proceed with lower extremity arteriogram.  Patient underwent the planned procedure on 03/28/2024, this revealed a small size abdominal aortic aneurysm, severe stenosis in the left common iliac artery with ulcerated plaque, otherwise mild infrarenal disease, mild right iliac disease was short of occlusion of right SFA with reconstitution via collateral and a three-vessel runoff below the knee.  Patient underwent successful self-expanding stent placement of left common iliac artery.  If patient develop symptoms in the right lower extremity, can consider right SFA endovascular intervention in the future.   He was placed on dual antiplatelet therapy.  Patient presents today for follow-up.  He has been walking with a limp for many years.  Since the procedure, his chronic lower back pain is improving.  He has good dorsalis pedis pulse in the left lower extremity.  I reviewed the recent intervention with the patient.  He has upcoming ABI and [name] near the end of this month.  After that, he likely will need a repeat study in 6 months before follow-up with Dr. Darron in June.  Overall, patient is doing well.  He denies any chest pain or shortness of breath.  He has been on dual antiplatelet therapy for a long time due to underlying CAD.    ROS:   He denies chest pain, palpitations, dyspnea, pnd, orthopnea, n, v, dizziness, syncope, edema, weight gain, or early satiety. All other systems reviewed and are otherwise negative except as noted above.    Studies Reviewed          Risk Assessment/Calculations           Physical Exam VS:  BP 104/66 (BP Location: Left Arm, Patient Position: Sitting, Cuff Size: Normal)   Pulse 88   Ht 6' 1 (1.854 m)   Wt 174 lb (78.9 kg)   SpO2 96%   BMI 22.96 kg/m        Wt Readings from Last 3 Encounters:  04/20/24 174 lb (78.9 kg)  03/28/24 176 lb (79.8 kg)  03/20/24 176 lb (79.8 kg)    GEN: Well nourished, well developed in no acute distress NECK: No JVD; No carotid bruits CARDIAC: RRR, no murmurs, rubs, gallops RESPIRATORY:  Clear to  auscultation without rales, wheezing or rhonchi  ABDOMEN: Soft, non-tender, non-distended EXTREMITIES:  No edema; No deformity   ASSESSMENT AND PLAN  PAD: Recently underwent left iliac artery stenting.  He also has a right SFA occlusion that had collaterals to the distal vessels with three-vessel runoff.  Right SFA occlusion was managed medically.  Upcoming ABI and aortoiliac ultrasound.  He did feel his back pain has improved on the left side after the procedure.  Continue aspirin  and Plavix   CAD: Patient denies any chest  pain  Hypertension: Blood pressure well-controlled.  Hyperlipidemia: Continue atorvastatin .         Dispo: Follow-up with Dr. Darron in June  Signed, Scot Ford, [place]

## 2024-04-20 NOTE — Patient Instructions (Signed)
 Medication Instructions:  - CONTINUE aspirin  and Plavix . Plavix  refill has been sent to your pharmacy.  *If you need a refill on your cardiac medications before your next appointment, please call your pharmacy*  Lab Work: - None ordered  Testing/Procedures: - Keep your upcoming vascular testing appointment on 05/09/24.   Follow-Up: At [facility], you and your health needs are our priority.  As part of our continuing mission to provide you with exceptional heart care, our providers are all part of one team.  This team includes your primary Cardiologist (physician) and Advanced Practice Providers or APPs (Physician Assistants and Nurse Practitioners) who all work together to provide you with the care you need, when you need it.  Your next appointment:   7 month(s) (end of June 2026)  Provider:   Dr. Darron    We recommend signing up for the patient portal called MyChart.  Sign up information is provided on this After Visit Summary.  MyChart is used to connect with patients for Virtual Visits (Telemedicine).  Patients are able to view lab/test results, encounter notes, upcoming appointments, etc.  Non-urgent messages can be sent to your provider as well.   To learn more about what you can do with MyChart, go to [email or web address].

## 2024-04-27 ENCOUNTER — Ambulatory Visit

## 2024-05-01 ENCOUNTER — Other Ambulatory Visit: Payer: Self-pay | Admitting: Cardiovascular Disease

## 2024-05-02 ENCOUNTER — Other Ambulatory Visit: Payer: Self-pay | Admitting: Cardiovascular Disease

## 2024-05-02 NOTE — Telephone Encounter (Signed)
[place] to fill   thanks

## 2024-05-09 ENCOUNTER — Ambulatory Visit (HOSPITAL_COMMUNITY)
Admission: RE | Admit: 2024-05-09 | Discharge: 2024-05-09 | Disposition: A | Discharge status: Home / Self Care | Source: Ambulatory Visit | Attending: Cardiovascular Disease | Admitting: Cardiovascular Disease

## 2024-05-09 ENCOUNTER — Ambulatory Visit (HOSPITAL_COMMUNITY)
Admission: RE | Admit: 2024-05-09 | Discharge: 2024-05-09 | Disposition: A | Source: Ambulatory Visit | Attending: Cardiovascular Disease | Admitting: Cardiovascular Disease

## 2024-05-09 DIAGNOSIS — I739 Peripheral vascular disease, unspecified: Secondary | ICD-10-CM | POA: Diagnosis present

## 2024-05-09 DIAGNOSIS — I10 Essential (primary) hypertension: Secondary | ICD-10-CM | POA: Diagnosis not present

## 2024-05-09 LAB — VAS US ABI WITH/WO TBI

## 2024-05-11 ENCOUNTER — Ambulatory Visit: Payer: Self-pay | Admitting: Cardiovascular Disease

## 2024-05-11 DIAGNOSIS — I739 Peripheral vascular disease, unspecified: Secondary | ICD-10-CM
